# Patient Record
Sex: Female | Born: 1980 | Race: White | Hispanic: No | Marital: Married | Smoking: Never smoker
Health system: Southern US, Community
[De-identification: ages and names within clinical notes are randomized; demographics above are authoritative.]

## PROBLEM LIST (undated history)

## (undated) DIAGNOSIS — K5190 Ulcerative colitis, unspecified, without complications: Principal | ICD-10-CM

## (undated) DIAGNOSIS — Z8679 Personal history of other diseases of the circulatory system: Secondary | ICD-10-CM

## (undated) DIAGNOSIS — Z8619 Personal history of other infectious and parasitic diseases: Secondary | ICD-10-CM

## (undated) DIAGNOSIS — R7611 Nonspecific reaction to tuberculin skin test without active tuberculosis: Secondary | ICD-10-CM

## (undated) HISTORY — PX: LASIK: SHX215

## (undated) HISTORY — DX: Personal history of other infectious and parasitic diseases: Z86.19

## (undated) HISTORY — DX: Ulcerative colitis, unspecified, without complications: K51.90

## (undated) HISTORY — DX: Personal history of other diseases of the circulatory system: Z86.79

## (undated) HISTORY — DX: Nonspecific reaction to tuberculin skin test without active tuberculosis: R76.11

---

## 2001-07-02 DIAGNOSIS — K5190 Ulcerative colitis, unspecified, without complications: Secondary | ICD-10-CM

## 2001-07-02 HISTORY — DX: Ulcerative colitis, unspecified, without complications: K51.90

## 2003-07-03 HISTORY — PX: CHOLECYSTECTOMY: SHX55

## 2009-07-02 DIAGNOSIS — R7611 Nonspecific reaction to tuberculin skin test without active tuberculosis: Secondary | ICD-10-CM

## 2009-07-02 HISTORY — DX: Nonspecific reaction to tuberculin skin test without active tuberculosis: R76.11

## 2010-04-14 ENCOUNTER — Ambulatory Visit: Payer: Self-pay

## 2010-10-21 ENCOUNTER — Emergency Department: Payer: Self-pay | Admitting: Emergency Medicine

## 2010-12-01 ENCOUNTER — Other Ambulatory Visit: Payer: Self-pay | Admitting: Internal Medicine

## 2011-01-30 ENCOUNTER — Ambulatory Visit: Payer: Self-pay | Admitting: Obstetrics and Gynecology

## 2011-03-13 ENCOUNTER — Other Ambulatory Visit: Payer: Self-pay | Admitting: Obstetrics and Gynecology

## 2011-04-03 ENCOUNTER — Ambulatory Visit: Payer: Self-pay | Admitting: Obstetrics and Gynecology

## 2011-06-07 ENCOUNTER — Observation Stay: Payer: Self-pay | Admitting: Obstetrics and Gynecology

## 2011-06-14 ENCOUNTER — Encounter: Payer: Self-pay | Admitting: Obstetrics and Gynecology

## 2011-07-05 ENCOUNTER — Encounter: Payer: Self-pay | Admitting: Maternal & Fetal Medicine

## 2011-08-16 ENCOUNTER — Inpatient Hospital Stay: Payer: Self-pay | Admitting: Obstetrics and Gynecology

## 2011-08-16 LAB — CBC WITH DIFFERENTIAL/PLATELET
Basophil #: 0.1 10*3/uL (ref 0.0–0.1)
Basophil %: 0.5 %
Eosinophil #: 0.2 10*3/uL (ref 0.0–0.7)
Eosinophil %: 1.7 %
HCT: 36 % (ref 35.0–47.0)
Lymphocyte #: 2.3 10*3/uL (ref 1.0–3.6)
MCH: 32.2 pg (ref 26.0–34.0)
MCV: 94 fL (ref 80–100)
Monocyte #: 1 10*3/uL — ABNORMAL HIGH (ref 0.0–0.7)
Monocyte %: 7.4 %
Neutrophil #: 9.4 10*3/uL — ABNORMAL HIGH (ref 1.4–6.5)
Neutrophil %: 72.6 %
Platelet: 218 10*3/uL (ref 150–440)
RDW: 13.2 % (ref 11.5–14.5)
WBC: 13 10*3/uL — ABNORMAL HIGH (ref 3.6–11.0)

## 2011-10-22 DIAGNOSIS — J029 Acute pharyngitis, unspecified: Secondary | ICD-10-CM | POA: Insufficient documentation

## 2011-12-06 DIAGNOSIS — R5383 Other fatigue: Secondary | ICD-10-CM | POA: Insufficient documentation

## 2012-07-02 HISTORY — PX: COLONOSCOPY: SHX174

## 2013-01-19 ENCOUNTER — Ambulatory Visit: Payer: Self-pay | Admitting: Urgent Care

## 2013-01-19 LAB — HCG, QUANTITATIVE, PREGNANCY: Beta Hcg, Quant.: <1 m[IU]/mL — ABNORMAL LOW

## 2013-01-22 ENCOUNTER — Ambulatory Visit: Payer: Self-pay | Admitting: Gastroenterology

## 2013-01-26 LAB — PATHOLOGY REPORT

## 2013-02-03 ENCOUNTER — Other Ambulatory Visit: Payer: Self-pay | Admitting: Urgent Care

## 2013-02-03 LAB — COMPREHENSIVE METABOLIC PANEL
Albumin: 3.8 g/dL (ref 3.4–5.0)
Alkaline Phosphatase: 61 U/L (ref 50–136)
Bilirubin,Total: 0.4 mg/dL (ref 0.2–1.0)
Co2: 30 mmol/L (ref 21–32)
Creatinine: 0.71 mg/dL (ref 0.60–1.30)
EGFR (African American): >60
EGFR (Non-African Amer.): >60
Potassium: 3.9 mmol/L (ref 3.5–5.1)
SGOT(AST): 24 U/L (ref 15–37)
Total Protein: 7.6 g/dL (ref 6.4–8.2)

## 2013-02-03 LAB — CBC WITH DIFFERENTIAL/PLATELET
Basophil #: 0.1 10*3/uL (ref 0.0–0.1)
Basophil %: 1 %
Eosinophil #: 0.4 10*3/uL (ref 0.0–0.7)
Eosinophil %: 4 %
HCT: 36.5 % (ref 35.0–47.0)
HGB: 12.9 g/dL (ref 12.0–16.0)
MCH: 31.7 pg (ref 26.0–34.0)
MCV: 90 fL (ref 80–100)
Neutrophil #: 6.8 10*3/uL — ABNORMAL HIGH (ref 1.4–6.5)
Neutrophil %: 72.2 %
Platelet: 360 10*3/uL (ref 150–440)
RBC: 4.08 10*6/uL (ref 3.80–5.20)
WBC: 9.4 10*3/uL (ref 3.6–11.0)

## 2013-02-10 ENCOUNTER — Ambulatory Visit: Payer: Self-pay | Admitting: Urgent Care

## 2013-03-17 ENCOUNTER — Encounter: Payer: Self-pay | Admitting: Family Medicine

## 2013-03-17 ENCOUNTER — Ambulatory Visit (INDEPENDENT_AMBULATORY_CARE_PROVIDER_SITE_OTHER): Admitting: Family Medicine

## 2013-03-17 VITALS — BP 118/76 | HR 79 | Temp 98.4°F | Ht 66.0 in | Wt 123.0 lb

## 2013-03-17 DIAGNOSIS — K5190 Ulcerative colitis, unspecified, without complications: Secondary | ICD-10-CM

## 2013-03-17 DIAGNOSIS — R7611 Nonspecific reaction to tuberculin skin test without active tuberculosis: Secondary | ICD-10-CM

## 2013-03-17 NOTE — Assessment & Plan Note (Signed)
Stable. Followed by GI.  Has f/u appt next week.  Recent dx but likely present since 2003

## 2013-03-17 NOTE — Assessment & Plan Note (Signed)
Sounds like she had true seroconversion, normal CxR. I recommended she check with GI re INH therapy in setting of UC, and then f/u with health department for treatment. Pt agrees with plan.

## 2013-03-17 NOTE — Progress Notes (Signed)
Subjective:    Patient ID: Mary Stephenson, female    DOB: 1981-03-25, 32 y.o.   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  Prior saw Dr. Veneda Melter but she recently moved.  UC - dx 11/2012.  Present since 2003 - but recently dx with Ulcerative Colitis.  On mesalamine bid for this.  Just finished steroid course for this.  Colonoscopy done this year.  EGD done as well - normal.  Certain foods trigger (such as high fiber foods).  GI - Dr. Servando Snare and Lorenza Burton [facility] surgical @ [facility]).  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  Recommended treatment, however nursing and then became pregnant.  Never f/u with health dept.  Interested in treatment.  Preventative: Well woman (12/2012) with GYN - Dr. 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  Past histories reviewed and updated if relevant as below. There are no active problems to display for this patient.  Past Medical History  Diagnosis Date  . Ulcerative colitis 2003  . History of chicken pox   . 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 Rheumatic fever     treated, s/p preventative abx for years   Past Surgical History  Procedure Laterality Date  . Cholecystectomy  2005   History  Substance Use Topics  . Smoking status: Never Smoker   . Smokeless tobacco: Never Used  . Alcohol Use: Yes     Comment: occassionally   Family History  Problem Relation Age of Onset  . Diabetes type I Brother 13  . Cancer Paternal Grandfather     esophageal (smoker)  . Cancer Other      breast (great grandmother)  . Cancer Other     colon (maternal great uncles)  . CAD Neg Hx   . Stroke Neg Hx   . Hypertension Neg Hx    Allergies  Allergen Reactions  . Penicillins Hives  . Sulfa Antibiotics Hives   No current outpatient prescriptions on file prior to visit.   No current facility-administered medications on file prior to visit.     Review of Systems  Constitutional: Negative for fever, chills, activity change, appetite change, fatigue and unexpected weight change.  HENT: Negative for hearing loss and neck pain.   Eyes: Negative for visual disturbance.  Respiratory: Negative for cough, chest tightness, shortness of breath and wheezing.   Cardiovascular: Negative for chest pain, palpitations and leg swelling.  Gastrointestinal: Negative for nausea, vomiting, abdominal pain, diarrhea, constipation, blood in stool and abdominal distention.  Genitourinary: Negative for hematuria and difficulty urinating.  Musculoskeletal: Negative for myalgias and arthralgias.  Skin: Negative for rash.  Neurological: Negative for dizziness, seizures, syncope and headaches.  Hematological: Negative for adenopathy. Does not bruise/bleed easily.  Psychiatric/Behavioral: Negative for dysphoric mood. The patient is not nervous/anxious.        Objective:   Physical Exam  Nursing note and vitals reviewed. Constitutional: She is oriented to person, place, and time. She appears well-developed and well-nourished. No distress.  HENT:  Head:  Normocephalic and atraumatic.  Right Ear: External ear normal.  Left Ear: External ear normal.  Nose: Nose normal.  Mouth/Throat: Oropharynx is clear and moist. No oropharyngeal exudate.  Eyes: Conjunctivae and EOM are normal. Pupils are equal, round, and reactive to light. No scleral icterus.  Neck: Normal range of motion. Neck supple. No thyromegaly present.  Cardiovascular: Normal rate, regular rhythm, normal heart sounds and intact distal  pulses.   No murmur heard. Pulses:      Radial pulses are 2+ on the right side, and 2+ on the left side.  No M appreciated  Pulmonary/Chest: Effort normal and breath sounds normal. No respiratory distress. She has no wheezes. She has no rales.  Abdominal: Soft. Bowel sounds are normal. She exhibits no distension and no mass. There is no tenderness. There is no rebound and no guarding.  Musculoskeletal: Normal range of motion. She exhibits no edema.  Lymphadenopathy:    She has no cervical adenopathy.  Neurological: She is alert and oriented to person, place, and time.  CN grossly intact, station and gait intact  Skin: Skin is warm and dry. No rash noted.  Psychiatric: She has a normal mood and affect. Her behavior is normal. Judgment and thought content normal.       Assessment & Plan:

## 2013-03-17 NOTE — Patient Instructions (Signed)
Nice to meet you today, call us with questions. Return in 1 year for physical, prior fasting for blood work

## 2013-07-13 ENCOUNTER — Ambulatory Visit: Payer: Self-pay | Admitting: Family Medicine

## 2013-12-07 ENCOUNTER — Encounter: Payer: Self-pay | Admitting: Family Medicine

## 2013-12-16 ENCOUNTER — Other Ambulatory Visit: Payer: Self-pay | Admitting: Urgent Care

## 2013-12-16 LAB — CBC WITH DIFFERENTIAL/PLATELET
Basophil #: 0.1 10*3/uL (ref 0.0–0.1)
Basophil %: 1.3 %
EOS PCT: 8.4 %
Eosinophil #: 0.5 10*3/uL (ref 0.0–0.7)
HCT: 40.1 % (ref 35.0–47.0)
HGB: 13.4 g/dL (ref 12.0–16.0)
LYMPHS ABS: 1.8 10*3/uL (ref 1.0–3.6)
Lymphocyte %: 28 %
MCH: 30.9 pg (ref 26.0–34.0)
MCHC: 33.4 g/dL (ref 32.0–36.0)
MCV: 93 fL (ref 80–100)
Monocyte #: 0.5 x10 3/mm (ref 0.2–0.9)
Monocyte %: 7.3 %
NEUTROS ABS: 3.5 10*3/uL (ref 1.4–6.5)
Neutrophil %: 55 %
PLATELETS: 307 10*3/uL (ref 150–440)
RBC: 4.34 10*6/uL (ref 3.80–5.20)
RDW: 12.2 % (ref 11.5–14.5)
WBC: 6.3 10*3/uL (ref 3.6–11.0)

## 2013-12-16 LAB — COMPREHENSIVE METABOLIC PANEL
Albumin: 4 g/dL (ref 3.4–5.0)
Alkaline Phosphatase: 50 U/L
Anion Gap: 4 — ABNORMAL LOW (ref 7–16)
BUN: 7 mg/dL (ref 7–18)
Bilirubin,Total: 0.5 mg/dL (ref 0.2–1.0)
Calcium, Total: 8.7 mg/dL (ref 8.5–10.1)
Chloride: 103 mmol/L (ref 98–107)
Co2: 31 mmol/L (ref 21–32)
Creatinine: 0.66 mg/dL (ref 0.60–1.30)
Glucose: 84 mg/dL (ref 65–99)
Osmolality: 273 (ref 275–301)
Potassium: 3.9 mmol/L (ref 3.5–5.1)
SGOT(AST): 31 U/L (ref 15–37)
SGPT (ALT): 24 U/L (ref 12–78)
Sodium: 138 mmol/L (ref 136–145)
Total Protein: 7.7 g/dL (ref 6.4–8.2)

## 2013-12-17 LAB — CLOSTRIDIUM DIFFICILE(ARMC)

## 2014-01-20 ENCOUNTER — Other Ambulatory Visit: Payer: Self-pay | Admitting: Urgent Care

## 2014-01-20 LAB — CBC WITH DIFFERENTIAL/PLATELET
Basophil #: 0.1 10*3/uL (ref 0.0–0.1)
Basophil %: 1.3 %
EOS ABS: 0.5 10*3/uL (ref 0.0–0.7)
Eosinophil %: 6 %
HCT: 41.2 % (ref 35.0–47.0)
HGB: 13.5 g/dL (ref 12.0–16.0)
LYMPHS PCT: 23.9 %
Lymphocyte #: 1.8 10*3/uL (ref 1.0–3.6)
MCH: 30.8 pg (ref 26.0–34.0)
MCHC: 32.8 g/dL (ref 32.0–36.0)
MCV: 94 fL (ref 80–100)
MONOS PCT: 6.9 %
Monocyte #: 0.5 x10 3/mm (ref 0.2–0.9)
Neutrophil #: 4.7 10*3/uL (ref 1.4–6.5)
Neutrophil %: 61.9 %
Platelet: 342 10*3/uL (ref 150–440)
RBC: 4.38 10*6/uL (ref 3.80–5.20)
RDW: 12.2 % (ref 11.5–14.5)
WBC: 7.6 10*3/uL (ref 3.6–11.0)

## 2014-01-20 LAB — COMPREHENSIVE METABOLIC PANEL
ALBUMIN: 4.3 g/dL (ref 3.4–5.0)
ALK PHOS: 52 U/L
ALT: 23 U/L
ANION GAP: 4 — AB (ref 7–16)
AST: 28 U/L (ref 15–37)
BUN: 8 mg/dL (ref 7–18)
Bilirubin,Total: 0.6 mg/dL (ref 0.2–1.0)
CREATININE: 0.71 mg/dL (ref 0.60–1.30)
Calcium, Total: 9 mg/dL (ref 8.5–10.1)
Chloride: 106 mmol/L (ref 98–107)
Co2: 29 mmol/L (ref 21–32)
EGFR (African American): >60
Glucose: 97 mg/dL (ref 65–99)
OSMOLALITY: 276 (ref 275–301)
Potassium: 3.8 mmol/L (ref 3.5–5.1)
SODIUM: 139 mmol/L (ref 136–145)
TOTAL PROTEIN: 8.5 g/dL — AB (ref 6.4–8.2)

## 2014-03-14 ENCOUNTER — Other Ambulatory Visit: Payer: Self-pay | Admitting: Family Medicine

## 2014-03-14 DIAGNOSIS — K5190 Ulcerative colitis, unspecified, without complications: Secondary | ICD-10-CM

## 2014-03-14 DIAGNOSIS — Z0000 Encounter for general adult medical examination without abnormal findings: Secondary | ICD-10-CM | POA: Insufficient documentation

## 2014-03-14 DIAGNOSIS — Z13220 Encounter for screening for lipoid disorders: Secondary | ICD-10-CM

## 2014-03-15 ENCOUNTER — Other Ambulatory Visit

## 2014-03-16 ENCOUNTER — Other Ambulatory Visit (INDEPENDENT_AMBULATORY_CARE_PROVIDER_SITE_OTHER): Payer: 59

## 2014-03-16 DIAGNOSIS — K5190 Ulcerative colitis, unspecified, without complications: Secondary | ICD-10-CM

## 2014-03-16 DIAGNOSIS — Z13220 Encounter for screening for lipoid disorders: Secondary | ICD-10-CM

## 2014-03-16 LAB — LIPID PANEL
CHOLESTEROL: 136 mg/dL (ref 0–200)
HDL: 48.7 mg/dL (ref >39.00)
LDL Cholesterol: 74 mg/dL (ref 0–99)
NonHDL: 87.3
Total CHOL/HDL Ratio: 3
Triglycerides: 66 mg/dL (ref 0.0–149.0)
VLDL: 13.2 mg/dL (ref 0.0–40.0)

## 2014-03-16 LAB — COMPREHENSIVE METABOLIC PANEL
ALT: 16 U/L (ref 0–35)
AST: 21 U/L (ref 0–37)
Albumin: 4.5 g/dL (ref 3.5–5.2)
Alkaline Phosphatase: 46 U/L (ref 39–117)
BILIRUBIN TOTAL: 0.9 mg/dL (ref 0.2–1.2)
BUN: 10 mg/dL (ref 6–23)
CO2: 29 meq/L (ref 19–32)
CREATININE: 0.7 mg/dL (ref 0.4–1.2)
Calcium: 9.5 mg/dL (ref 8.4–10.5)
Chloride: 103 mEq/L (ref 96–112)
GFR: 97.95 mL/min (ref >60.00)
GLUCOSE: 91 mg/dL (ref 70–99)
Potassium: 3.8 mEq/L (ref 3.5–5.1)
Sodium: 139 mEq/L (ref 135–145)
Total Protein: 7.7 g/dL (ref 6.0–8.3)

## 2014-03-17 LAB — CBC WITH DIFFERENTIAL/PLATELET
BASOS PCT: 0.5 % (ref 0.0–3.0)
Basophils Absolute: 0 10*3/uL (ref 0.0–0.1)
Eosinophils Absolute: 0.4 10*3/uL (ref 0.0–0.7)
Eosinophils Relative: 6.5 % — ABNORMAL HIGH (ref 0.0–5.0)
HCT: 39.8 % (ref 36.0–46.0)
Hemoglobin: 13.4 g/dL (ref 12.0–15.0)
LYMPHS PCT: 27.7 % (ref 12.0–46.0)
Lymphs Abs: 1.9 10*3/uL (ref 0.7–4.0)
MCHC: 33.6 g/dL (ref 30.0–36.0)
MCV: 93.1 fl (ref 78.0–100.0)
MONOS PCT: 7.1 % (ref 3.0–12.0)
Monocytes Absolute: 0.5 10*3/uL (ref 0.1–1.0)
Neutro Abs: 3.9 10*3/uL (ref 1.4–7.7)
Neutrophils Relative %: 58.2 % (ref 43.0–77.0)
Platelets: 349 10*3/uL (ref 150.0–400.0)
RBC: 4.27 Mil/uL (ref 3.87–5.11)
RDW: 13.3 % (ref 11.5–15.5)
WBC: 6.7 10*3/uL (ref 4.0–10.5)

## 2014-03-22 ENCOUNTER — Encounter: Admitting: Family Medicine

## 2014-03-29 ENCOUNTER — Encounter: Admitting: Family Medicine

## 2014-04-01 ENCOUNTER — Encounter: Payer: Self-pay | Admitting: Family Medicine

## 2014-04-01 ENCOUNTER — Ambulatory Visit (INDEPENDENT_AMBULATORY_CARE_PROVIDER_SITE_OTHER): Payer: 59 | Admitting: Family Medicine

## 2014-04-01 VITALS — BP 104/60 | HR 72 | Temp 98.3°F | Ht 66.0 in | Wt 122.2 lb

## 2014-04-01 DIAGNOSIS — R7611 Nonspecific reaction to tuberculin skin test without active tuberculosis: Secondary | ICD-10-CM

## 2014-04-01 DIAGNOSIS — M533 Sacrococcygeal disorders, not elsewhere classified: Secondary | ICD-10-CM

## 2014-04-01 DIAGNOSIS — Z7952 Long term (current) use of systemic steroids: Secondary | ICD-10-CM

## 2014-04-01 DIAGNOSIS — Z0000 Encounter for general adult medical examination without abnormal findings: Secondary | ICD-10-CM

## 2014-04-01 DIAGNOSIS — K5190 Ulcerative colitis, unspecified, without complications: Secondary | ICD-10-CM

## 2014-04-01 NOTE — Progress Notes (Signed)
BP 104/60  Pulse 72  Temp(Src) 98.3 F (36.8 C) (Oral)  Ht 5\' 6"  (1.676 m)  Wt 122 lb 4 oz (55.452 kg)  BMI 19.74 kg/m2  SpO2 98%  LMP 03/09/2014   CC: CPE  Subjective:    Patient ID: Mary Stephenson, female    DOB: May 18, 1981, 33 y.o.   MRN: [id number]  HPI: Mary Stephenson is a 33 y.o. female presenting on 04/01/2014 for Annual Exam, Check Knot on Bottom and Discuss getting a Bone density test    Rec DEXA by GI given h/o UC and prednisone.   She did complete 4 mo course of rifampin for recent TB serocoversion with pos quantiferon test. This flared UC - treated with prolonged prednisone taper over 4-6 weeks. Off prednisone for last 1+ month.  Pain in tailbone area - stays active with 2 small children. [place]. Small lump noted   Preventative: Well woman (12/2012) with GYN - Dr. Sharyl Nimrodifranchesco [facility]  Colonoscopy 2014  Flu shot - to get at work  Tdap - 2010  LMP - 03/09/2014, husband with vasectomy  Lives with husband and 2 children (2010, 2013), 2 dogs  Occupation: Education officer, environmentalsonographer at [employer]  Edu: College  Activity: no regular exercise  Diet: good water, avoids high fiber 2/2 UC   Relevant past medical, surgical, family and social history reviewed and updated as indicated.  Allergies and medications reviewed and updated. Current Outpatient Prescriptions on File Prior to Visit  Medication Sig  . Ascorbic Acid (VITAMIN C) 1000 MG tablet Take 2,000 mg by mouth daily.  . B Complex-C (SUPER B COMPLEX PO) Take by mouth daily.  . cholecalciferol (VITAMIN D) 1000 UNITS tablet Take 1,000 Units by mouth daily.  . mesalamine (LIALDA) 1.2 G EC tablet Take 1 tabs by mouth two times daily  . Multiple Vitamin (MULTIVITAMIN) tablet Take 1 tablet by mouth daily.  Marland Kitchen. zinc gluconate 50 MG tablet Take 50 mg by mouth daily.   No current facility-administered medications on file prior to visit.    Review of Systems  Constitutional:  Negative for fever, chills, activity change, appetite change, fatigue and unexpected weight change.  HENT: Negative for hearing loss.   Eyes: Negative for visual disturbance.  Respiratory: Negative for cough, chest tightness, shortness of breath and wheezing.   Cardiovascular: Negative for chest pain, palpitations and leg swelling.  Gastrointestinal: Positive for abdominal pain (stable) and diarrhea (UC related). Negative for nausea, vomiting, constipation, blood in stool and abdominal distention.  Genitourinary: Negative for hematuria and difficulty urinating.  Musculoskeletal: Negative for arthralgias, myalgias and neck pain.  Skin: Negative for rash.  Neurological: Negative for dizziness, seizures, syncope and headaches.  Hematological: Negative for adenopathy. Does not bruise/bleed easily.  Psychiatric/Behavioral: Negative for dysphoric mood. The patient is not nervous/anxious.    Per HPI unless specifically indicated above    Objective:    BP 104/60  Pulse 72  Temp(Src) 98.3 F (36.8 C) (Oral)  Ht 5\' 6"  (1.676 m)  Wt 122 lb 4 oz (55.452 kg)  BMI 19.74 kg/m2  SpO2 98%  LMP 03/09/2014  Physical Exam  Nursing note and vitals reviewed. Constitutional: She is oriented to person, place, and time. She appears well-developed and well-nourished. No distress.  HENT:  Head: Normocephalic and atraumatic.  Right Ear: Hearing, tympanic membrane, external ear and ear canal normal.  Left Ear: Hearing, tympanic membrane, external ear and ear canal normal.  Nose: Nose normal.  Mouth/Throat: Uvula is midline, oropharynx is clear and moist and mucous membranes are normal. No oropharyngeal exudate, posterior oropharyngeal edema or posterior oropharyngeal erythema.  Eyes: Conjunctivae and EOM are normal. Pupils are equal, round, and reactive to light. No scleral icterus.  Neck: Normal range of motion. Neck supple. No thyromegaly present.  Cardiovascular: Normal rate, regular rhythm, normal heart  sounds and intact distal pulses.   No murmur heard. Pulses:      Radial pulses are 2+ on the right side, and 2+ on the left side.  Pulmonary/Chest: Effort normal and breath sounds normal. No respiratory distress. She has no wheezes. She has no rales.  Abdominal: Soft. Bowel sounds are normal. She exhibits no distension and no mass. There is no tenderness. There is no rebound and no guarding.  Musculoskeletal: Normal range of motion. She exhibits no edema.  No midline spine tenderness Tender to palpation at tip of coccyx with firm swelling evident of left coccyx without erythema or induration.  Lymphadenopathy:    She has no cervical adenopathy.  Neurological: She is alert and oriented to person, place, and time.  CN grossly intact, station and gait intact  Skin: Skin is warm and dry. No rash noted.  Psychiatric: She has a normal mood and affect. Her behavior is normal. Judgment and thought content normal.   Results for orders placed in visit on 03/16/14  LIPID PANEL      Result Value Ref Range   Cholesterol 136  0 - 200 mg/dL   Triglycerides 16.1  0.0 - 149.0 mg/dL   HDL 09.60  >45.40 mg/dL   VLDL 98.1  0.0 - 19.1 mg/dL   LDL Cholesterol 74  0 - 99 mg/dL   Total CHOL/HDL Ratio 3     NonHDL 87.30    COMPREHENSIVE METABOLIC PANEL      Result Value Ref Range   Sodium 139  135 - 145 mEq/L   Potassium 3.8  3.5 - 5.1 mEq/L   Chloride 103  96 - 112 mEq/L   CO2 29  19 - 32 mEq/L   Glucose, Bld 91  70 - 99 mg/dL   BUN 10  6 - 23 mg/dL   Creatinine, Ser 0.7  0.4 - 1.2 mg/dL   Total Bilirubin 0.9  0.2 - 1.2 mg/dL   Alkaline Phosphatase 46  39 - 117 U/L   AST 21  0 - 37 U/L   ALT 16  0 - 35 U/L   Total Protein 7.7  6.0 - 8.3 g/dL   Albumin 4.5  3.5 - 5.2 g/dL   Calcium 9.5  8.4 - 47.8 mg/dL   GFR 29.56  >21.30 mL/min  CBC WITH DIFFERENTIAL      Result Value Ref Range   WBC 6.7  4.0 - 10.5 K/uL   RBC 4.27  3.87 - 5.11 Mil/uL   Hemoglobin 13.4  12.0 - 15.0 g/dL   HCT 86.5  78.4 -  69.6 %   MCV 93.1  78.0 - 100.0 fl   MCHC 33.6  30.0 - 36.0 g/dL   RDW 29.5  28.4 - 13.2 %   Platelets 349.0  150.0 - 400.0 K/uL   Neutrophils Relative % 58.2  43.0 - 77.0 %   Lymphocytes Relative 27.7  12.0 - 46.0 %   Monocytes Relative 7.1  3.0 - 12.0 %   Eosinophils Relative 6.5 (*) 0.0 - 5.0 %   Basophils Relative 0.5  0.0 - 3.0 %   Neutro Abs 3.9  1.4 - 7.7 K/uL   Lymphs Abs 1.9  0.7 - 4.0 K/uL   Monocytes Absolute 0.5  0.1 - 1.0 K/uL   Eosinophils Absolute 0.4  0.0 - 0.7 K/uL   Basophils Absolute 0.0  0.0 - 0.1 K/uL      Assessment & Plan:   Problem List Items Addressed This Visit   Ulcerative colitis     Appreciate GI care. Recent flare completed prednisone use. Will check baseline DEXA in setting of UC and prednisone use    Relevant Orders      DG Bone Density   Positive TB test     Completed treatment with rifampin x 4 months. rec no further testing.    Health maintenance examination - Primary     Preventative protocols reviewed and updated unless pt declined. Discussed healthy diet and lifestyle.     Coccyx pain     ?bony contusion of coccyx vs coccydynia. Not consistent with cyst or fistula. Pt will start using cushions when seated on firm surfaces and update me with effect in 1-2 weeks.  Consider xray if not improving.     Other Visit Diagnoses   Current use of steroid medication        Relevant Orders       DG Bone Density        Follow up plan: Return in about 1 year (around 04/02/2015), or as needed, for physical.

## 2014-04-01 NOTE — Assessment & Plan Note (Signed)
Completed treatment with rifampin x 4 months. rec no further testing.

## 2014-04-01 NOTE — Progress Notes (Signed)
Pre visit review using our clinic review tool, if applicable. No additional management support is needed unless otherwise documented below in the visit note. 

## 2014-04-01 NOTE — Assessment & Plan Note (Signed)
?  bony contusion of coccyx vs coccydynia. Not consistent with cyst or fistula. Pt will start using cushions when seated on firm surfaces and update me with effect in 1-2 weeks.  Consider xray if not improving.

## 2014-04-01 NOTE — Patient Instructions (Addendum)
Good to see you today! Call us with questions. Pass by [name]'s office to schedule bone density scan. Use cushion when sitting on firm surface - and update us if not improving as expected in 1-2 weeks.

## 2014-04-01 NOTE — Assessment & Plan Note (Signed)
Appreciate GI care. Recent flare completed prednisone use. Will check baseline DEXA in setting of UC and prednisone use

## 2014-04-01 NOTE — Assessment & Plan Note (Signed)
Preventative protocols reviewed and updated unless pt declined. Discussed healthy diet and lifestyle.  

## 2014-05-01 ENCOUNTER — Encounter: Payer: Self-pay | Admitting: Family Medicine

## 2014-05-24 DIAGNOSIS — K5150 Left sided colitis without complications: Secondary | ICD-10-CM | POA: Insufficient documentation

## 2014-05-24 DIAGNOSIS — Z8611 Personal history of tuberculosis: Secondary | ICD-10-CM | POA: Insufficient documentation

## 2014-06-01 HISTORY — PX: OTHER SURGICAL HISTORY: SHX169

## 2014-06-01 HISTORY — PX: FLEXIBLE SIGMOIDOSCOPY: SHX1649

## 2014-06-11 ENCOUNTER — Encounter: Payer: Self-pay | Admitting: Family Medicine

## 2014-06-11 ENCOUNTER — Ambulatory Visit: Payer: Self-pay | Admitting: Family Medicine

## 2014-06-12 ENCOUNTER — Encounter: Payer: Self-pay | Admitting: Family Medicine

## 2014-06-16 ENCOUNTER — Encounter: Payer: Self-pay | Admitting: *Deleted

## 2014-06-28 ENCOUNTER — Ambulatory Visit: Payer: Self-pay | Admitting: Gastroenterology

## 2014-07-10 ENCOUNTER — Encounter: Payer: Self-pay | Admitting: Family Medicine

## 2014-07-14 ENCOUNTER — Encounter: Payer: Self-pay | Admitting: Family Medicine

## 2014-09-09 IMAGING — CT CT ABD-PELV W/ CM
1 of 2 series · 15 of 32 positions shown, 19 images · IV contrast (isovue)
Comparison: none

REASON FOR EXAM: lower abd pain
COMMENTS:

PROCEDURE:     CT  - CT ABDOMEN / PELVIS  W  - January 19, 2013  [DATE]
RESULT:     Comparison:  None
TECHNIQUE: Multiple axial images of the abdomen and pelvis were performed
from the lung bases to the pubic symphysis, with p.o. contrast and with 100
mL of Isovue 300 intravenous contrast.

[Series 2: 3mm soft tissue · axial · 0.68mm/px · z∈[-930,-504]mm · 15 of 156 slices shown, 19 images]
[im 7/156  soft-tissue]
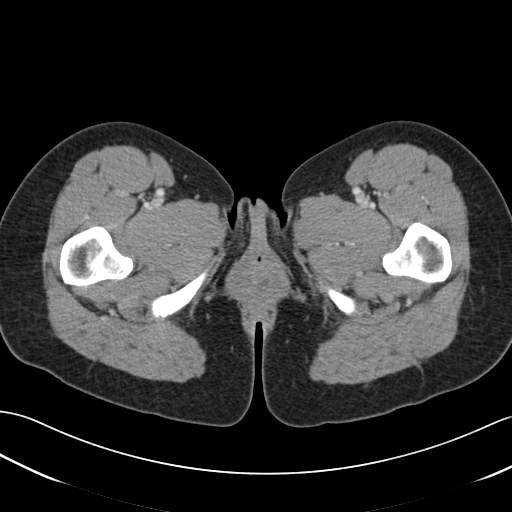
[im 7/156  bone]
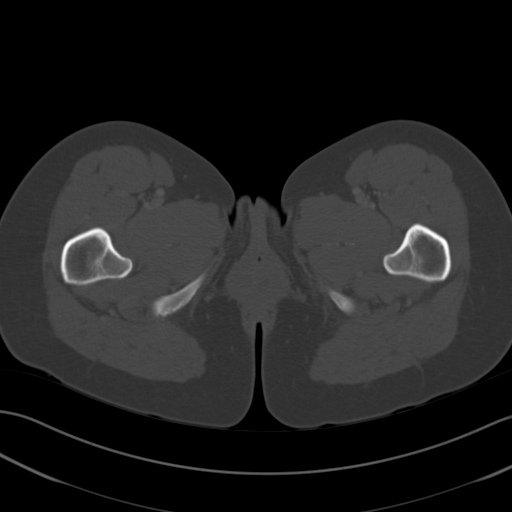
[im 21/156  soft-tissue]
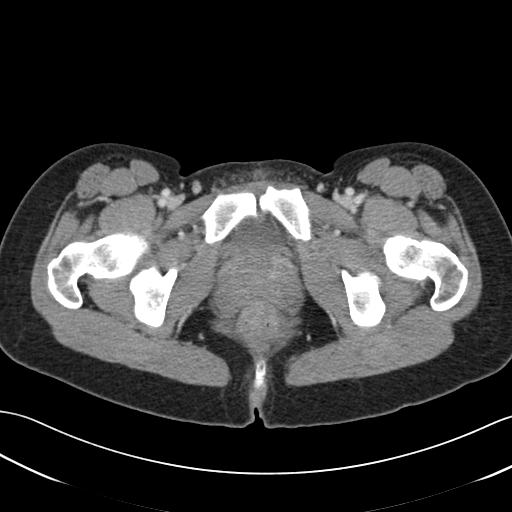
[im 34/156  soft-tissue]
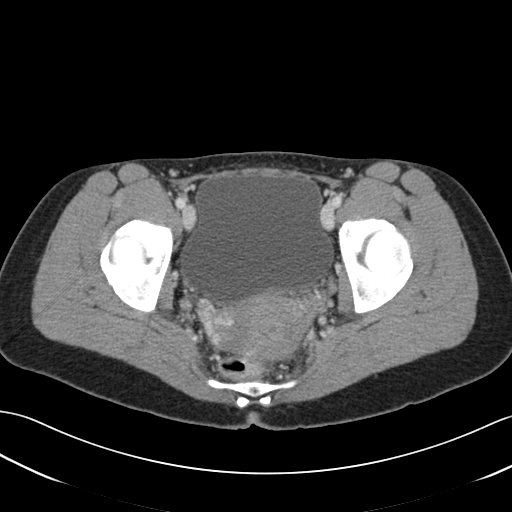
[im 41/156  soft-tissue]
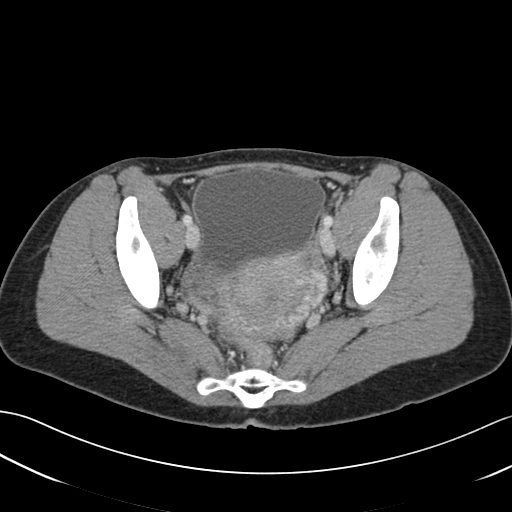
[im 54/156  soft-tissue]
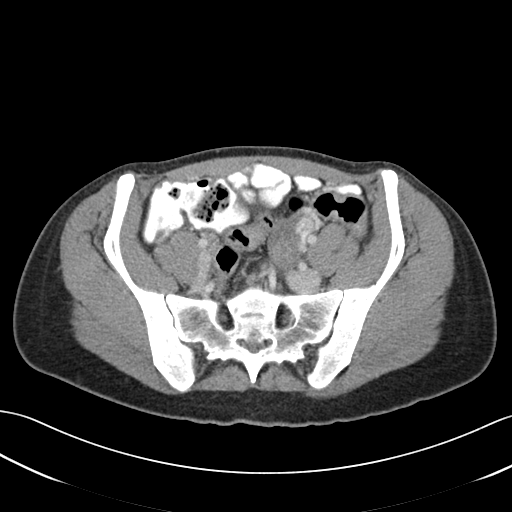
[im 68/156  soft-tissue]
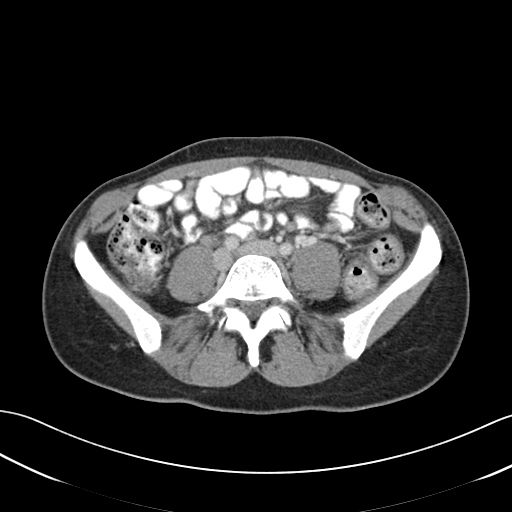
[im 81/156  soft-tissue]
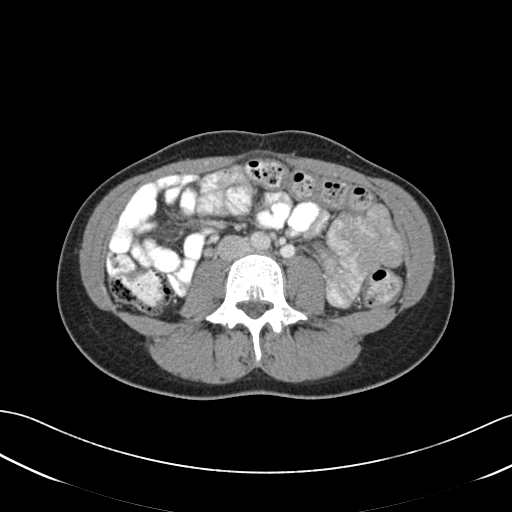
[im 88/156  soft-tissue]
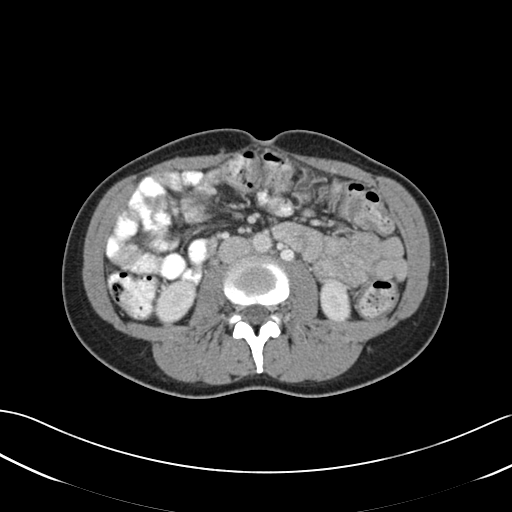
[im 102/156  soft-tissue]
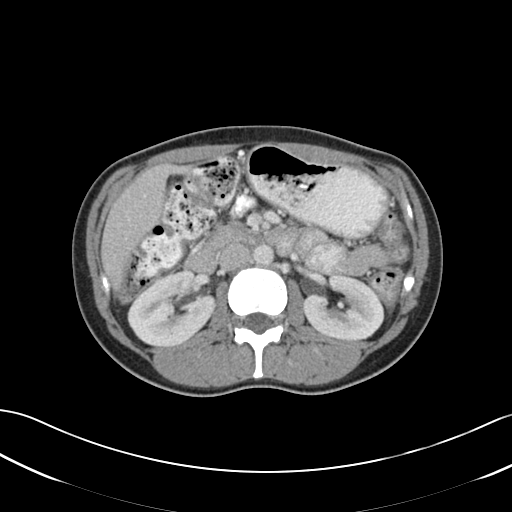
[im 102/156  bone]
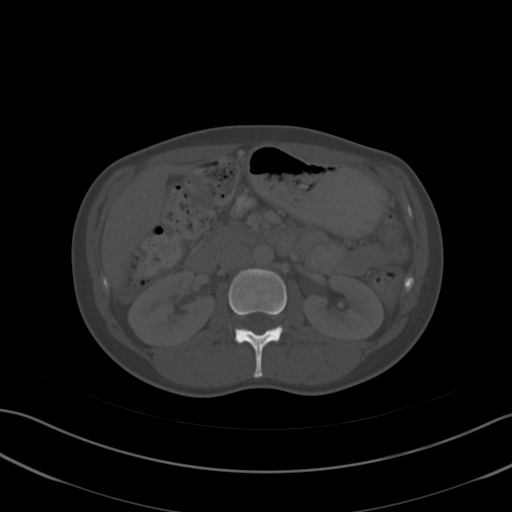
[im 115/156  soft-tissue]
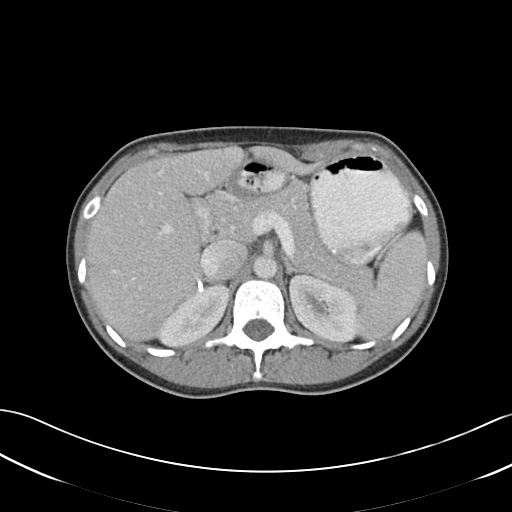
[im 122/156  soft-tissue]
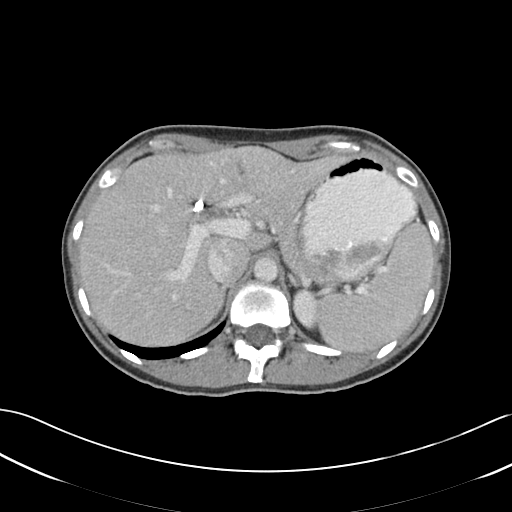
[im 129/156  lung]
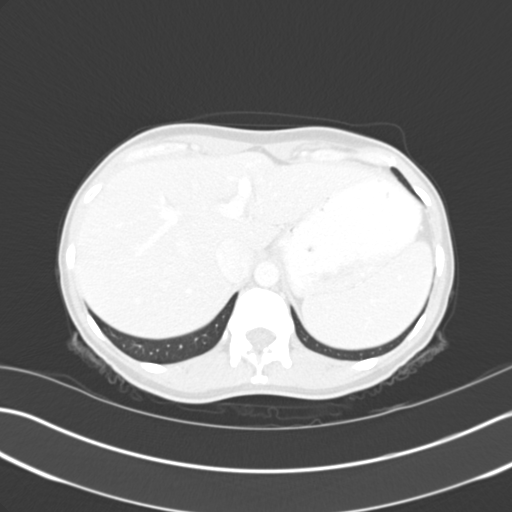
[im 135/156  soft-tissue]
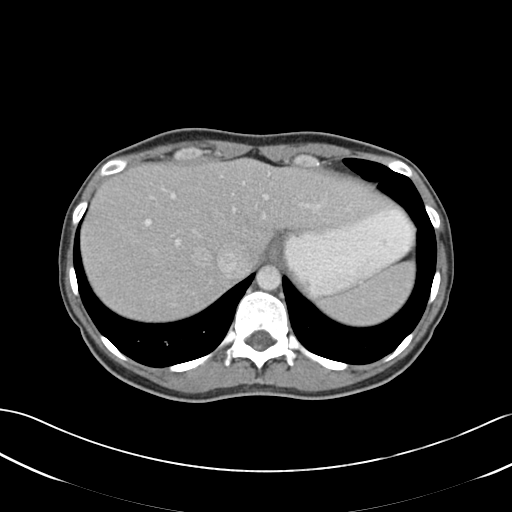
[im 135/156  lung]
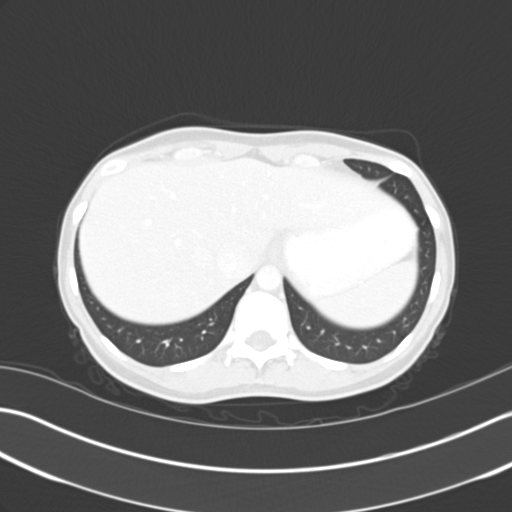
[im 142/156  lung]
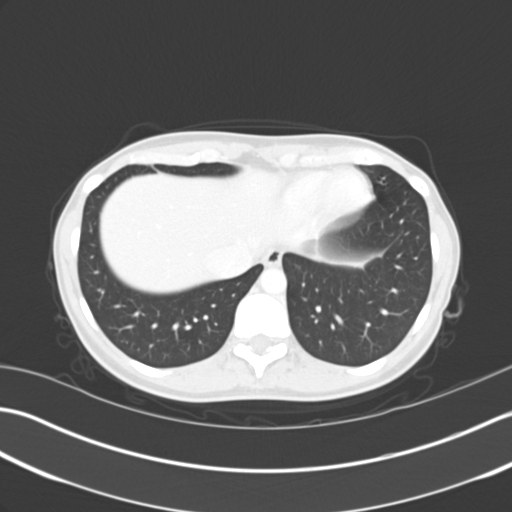
[im 149/156  soft-tissue]
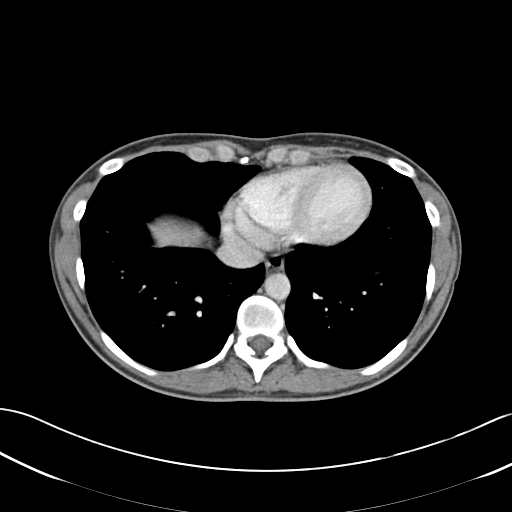
[im 149/156  lung]
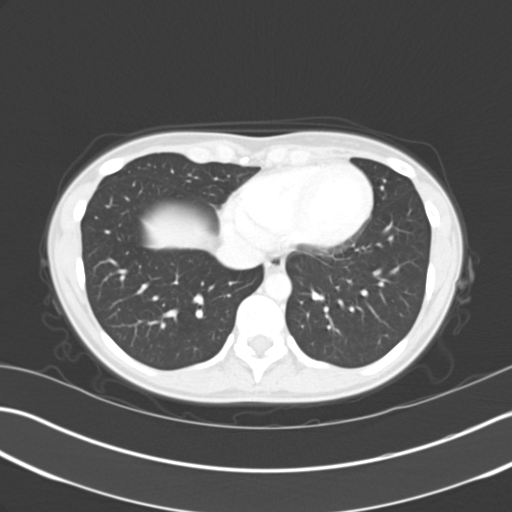

[15 of 32 positions shown; findings below may reference images not displayed]

FINDINGS: The liver, spleen, adrenals, and pancreas are unremarkable. Surgical clips
are seen from prior cholecystectomy. Multiple surgical clips are seen
posterior to the right hepatic lobe. The kidneys enhance normally.

The appendix is at the upper limits of normal in diameter. There are no
adjacent inflammatory changes. There is mild prominence of the wall of the
descending and sigmoid colon, which may in part be related to
underdistention. There is a trace amount of free fluid in pelvis, which is
likely physiologic.

No aggressive lytic or sclerotic osseous lesions are identified.
IMPRESSION: Mild prominence of the wall of the descending and sigmoid colon may in part
be related to underdistention. However, the possibility of colitis is not
excluded. Differential would include infectious and inflammatory etiologies.

[REDACTED]

The report was called to the ordering clinician immediately after the
dictation.

## 2014-11-09 NOTE — H&P (Signed)
L&D Evaluation:  History:   HPI 30 yowmf G2P1001, 39.[redacted] weeks gestation    Presents with IOL    Patient's Medical History No Chronic Illness    Patient's Surgical History none    Medications Pre Natal Vitamins    Allergies PCN, Sulfa    Social History none    Family History Non-Contributory   ROS:   ROS All systems were reviewed.  HEENT, CNS, GI, GU, Respiratory, CV, Renal and Musculoskeletal systems were found to be normal.   Exam:   Vital Signs stable    Urine Protein negative dipstick    General no apparent distress    Heart normal sinus rhythm    Abdomen gravid, non-tender    Estimated Fetal Weight Average for gestational age    Back no CVAT    Edema no edema    Pelvic no external lesions, 3/80/-2/vtx/BOWI, AROM clear    FHT normal rate with no decels    Skin dry    Lymph no lymphadenopathy   Impression:   Impression TIUP   Plan:   Plan Pitocin IOL    Comments O+/GBS-/HIV-/HB-/RI/VI   Electronic Signatures: Leandro Berkowitz, Prentice DockerMartin A (MD)  (Signed 14-Feb-13 12:20)  Authored: L&D Evaluation   Last Updated: 14-Feb-13 12:20 by Kandace Elrod, Prentice DockerMartin A (MD)

## 2014-12-20 ENCOUNTER — Other Ambulatory Visit: Payer: Self-pay | Admitting: Urgent Care

## 2014-12-20 NOTE — Telephone Encounter (Signed)
Pt has called to see if she needs to F/U with Lorenza Burton for medication refill. Pt is on Lialda and Canasa. Pt has 4 refills. Doing well. Pt was just concerned if she need a f/u with Lorenza Burton, last seen in July of 2015.

## 2014-12-20 NOTE — Telephone Encounter (Signed)
Spoke with patient at this time. States that she has not had any further care at [facility]. She just went one time for initial consultation. Pt placed on schedule to be seen 12/28/14 at 0930am.

## 2014-12-28 ENCOUNTER — Ambulatory Visit (INDEPENDENT_AMBULATORY_CARE_PROVIDER_SITE_OTHER): Payer: Federal, State, Local not specified - PPO | Admitting: Urgent Care

## 2014-12-28 ENCOUNTER — Encounter: Payer: Self-pay | Admitting: Urgent Care

## 2014-12-28 VITALS — BP 122/82 | HR 85 | Temp 98.6°F | Ht 66.0 in | Wt 129.4 lb

## 2014-12-28 DIAGNOSIS — K5190 Ulcerative colitis, unspecified, without complications: Secondary | ICD-10-CM

## 2014-12-28 MED ORDER — MESALAMINE 1.2 G PO TBEC: 4.8000 g | DELAYED_RELEASE_TABLET | Freq: Every day | ORAL | Status: DC

## 2014-12-28 NOTE — Patient Instructions (Signed)
Continue lialda 4.8 grams daily Canasa 1gram weekly Follow up with Dr Servando SnareWohl in 6 months or sooner if needed  Ulcerative Colitis Ulcerative colitis is a long lasting swelling and soreness (inflammation) of the colon (large intestine). In patients with ulcerative colitis, sores (ulcers) and inflammation of the inner lining of the colon lead to illness. Ulcerative colitis can also cause problems outside the digestive tract.  Ulcerative colitis is closely related to another condition of inflammation of the intestines called Crohn's disease. Together, they are frequently referred to as inflammatory bowel disease (IBD). Ulcerative colitis and Crohn's diseases are conditions that can last years to decades. Men and women are affected equally. They most commonly begin during adolescence and early adulthood. SYMPTOMS  Common symptoms of ulcerative colitis include rectal bleeding and diarrhea. There is a wide range of symptoms among patients with this disease depending on how severe the disease is. Some of these symptoms are:  Abdominal pain or cramping.  Diarrhea.  Fever.  Tiredness (fatigue).  Weight loss.  Night sweats.  Rectal pain.  Feeling the immediate need to have a bowel movement (rectal urgency). CAUSES  Ulcerative colitis is caused by increased activity of the immune system in the intestines. The immune system is the system that protects the body against disease such as harmful bacteria, viruses, fungi, and other foreign invaders. When the immune system overacts, it causes inflammation. The cause of the increased immune system activity is not known. This over activity causes long-lasting inflammation and ulceration. This condition may be passed down from your parents (inherited). Brothers, sisters, children, and parents of patients with IBD are more likely to develop these diseases. It is not contagious. This means you cannot catch it from someone else. DIAGNOSIS  Your caregiver may  suspect ulcerative colitis based on your symptoms and exam. Blood tests may confirm that there is a problem. You may be asked to submit a stool specimen for examination. X-rays and CT scans may be necessary. Ultimately, the diagnosis is usually made after a flexible tube is inserted via your anus and your colon is examined under sedation (colonoscopy). With this test, the specialist can take a tiny tissue sample from inside the bowel (biopsy). Examination of this biopsy tissue under a microscopy can reveal ulcerative colitis as the cause of your symptoms. TREATMENT   There is no cure for ulcerative colitis.  Complications such as massive bleeding from the colon (hemorrhage), development of a hole in the colon (perforation), or the development of precancerous or cancerous changes of the colon may require surgery.  Medications are often used to decrease inflammation and control the immune system. These include medicines related to aspirin, steroid medications, and newer and stronger medications to slow down the immune system. Some medications may be used as suppositories or enemas. A number of other medications are used or have been studied. Your caregiver will make specific recommendations. HOME CARE INSTRUCTIONS   There is no cure for ulcerative colitis disease. The best treatment is frequent checkups with your caregiver. Periodic reevaluation is important.  Symptoms such as diarrhea can be controlled with medications. Avoid foods that have a laxative effect such fresh fruit and vegetables and dairy products. During flare ups, you can rest your bowel by staying away from solid foods. Drink clear liquids frequently during the day. Electrolyte or rehydrating fluids are best. Your caregiver can help you with suggestions. Drink often to prevent dehydration. When diarrhea has cleared, eat smaller meals and more often. Avoid food additives and stimulants  such as caffeine (coffee, tea, many sodas, or  chocolate). Avoid dairy products. Enzyme supplements may help if you develop intolerance to a sugar in dairy products (lactose). Ask your caregiver or dietitian about specific dietary instructions.  If you had surgery, be sure you understand your care instructions thoroughly, including proper care of any surgical wounds.  Take any medications exactly as prescribed.  Try to maintain a positive attitude. Learn relaxation techniques such as self hypnosis, mental imaging, and muscle relaxation. If possible, avoid stresses that aggravate your condition. Exercise regularly. Follow your diet. Always get plenty of rest. SEEK MEDICAL CARE IF:   Your symptoms fail to improve after a week or two of new treatment.  You experience continued weight loss.  You have ongoing crampy digestion or loose bowels.  You develop a new skin rash, skin sores, or eye problems. SEEK IMMEDIATE MEDICAL CARE IF:   You have worsening of your symptoms or develop new symptoms.  You have an oral temperature above 102 F (38.9 C), not controlled by medicine.  You develop bloody diarrhea.  You have severe abdominal pain. Document Released: 03/28/2005 Document Revised: 09/10/2011 Document Reviewed: 02/25/2007 [facility] Patient Information 2015 [place], [place]. This information is not intended to replace advice given to you by your health care provider. Make sure you discuss any questions you have with your health care provider.

## 2014-12-28 NOTE — Progress Notes (Signed)
   Primary Care Physician: Eustaquio BoydenJavier Gutierrez, MD Primary Gastroenterologist:  Dr Servando SnareWohl  Chief Complaint  Patient presents with  . Follow-up    Ulcerative Colitis    HPI: Mary Stephenson is a 34 y.o. female here for follow up of ulcerative colitis.   Dx 2003.  She is eating very well. She is taking Lialda 4.8 g daily at lunch. She also used Canasa suppositories once weekly on Fridays. She abdominal pain, fever, nausea or vomiting. Denies any rectal bleeding or melena. Denies any diarrhea.  Current Outpatient Prescriptions  Medication Sig Dispense Refill  . Ascorbic Acid (VITAMIN C) 1000 MG tablet Take 2,000 mg by mouth daily.    . B Complex-C (SUPER B COMPLEX PO) Take by mouth daily.    . cholecalciferol (VITAMIN D) 1000 UNITS tablet Take 1,000 Units by mouth daily.    . mesalamine (CANASA) 1000 MG suppository Place 1,000 mg rectally once a week. Fridays    . mesalamine (LIALDA) 1.2 G EC tablet Take 4 tablets (4.8 g total) by mouth daily. 4 tabs daily 120 tablet 5  . Multiple Vitamin (MULTIVITAMIN) tablet Take 1 tablet by mouth daily.    . Probiotic Product (ALIGN PO) Take 1 tablet by mouth daily.    Marland Kitchen. zinc gluconate 50 MG tablet Take 50 mg by mouth daily.     No current facility-administered medications for this visit.    Allergies as of 12/28/2014 - Review Complete 12/28/2014  Allergen Reaction Noted  . Penicillins Hives 03/17/2013  . Sulfa antibiotics Hives 03/17/2013    Review of Systems: Gen: Denies any fever, chills, fatigue, weakness, malaise ENT: Negative for hoarseness, difficulty swallowing , nasal congestion CV: Denies chest pain, angina, palpitations, syncope, orthopnea, PND, peripheral edema, and claudication. Resp: Denies dyspnea at rest, dyspnea with exercise, cough, sputum, wheezing, coughing up blood, and pleurisy. GI: See HPI GU:  Negative for dysuria, hematuria, urinary incontinence, urinary frequency, nocturnal urination.  Endo: Negative for unusual  weight change or sweats Derm: Denies jaundice, rash, itching, or unhealing ulcers.  Psych: Denies depression, anxiety, memory loss, suicidal ideation, hallucinations, paranoia, and confusion. Heme: Denies bruising, bleeding, and enlarged lymph nodes.   Physical Examination:  BP 122/82 mmHg  Pulse 85  Temp(Src) 98.6 F (37 C) (Oral)  Ht 5\' 6"  (1.676 m)  Wt 129 lb 6.4 oz (58.695 kg)  BMI 20.90 kg/m2  LMP 12/27/2014 Body mass index is 20.9 kg/(m^2). Patient's last menstrual period was 12/27/2014. General:   Alert,  Well-developed, well-nourished, pleasant and cooperative in NAD Head:  Normocephalic and atraumatic. Eyes:  Sclera clear, no icterus.   Conjunctiva pink. Mouth:  No deformity or lesions.  Oropharynx pink & moist. Neck:  Supple; no masses or thyromegaly. Heart:  Regular rate and rhythm; no murmurs, clicks, rubs,  or gallops. Abdomen:   Normal bowel sounds.  Soft, nontender and nondistended. No masses, hepatosplenomegaly or hernias noted. No guarding or rebound tenderness.   Msk:  Symmetrical without gross deformities. Normal posture. Pulses:  Normal pulses noted. Extremities:  Without clubbing or edema. Neurologic:  Alert and  oriented x3;  grossly normal neurologically. Skin:  Intact without significant lesions or rashes. Cervical Nodes:  No significant cervical adenopathy. Psych:  Alert and cooperative. Normal mood and affect.

## 2014-12-28 NOTE — Assessment & Plan Note (Signed)
Doing very well. Continue Lialda and Canasa as ordered Follow Up with Dr. Servando SnareWohl in 6 months

## 2015-01-11 ENCOUNTER — Encounter: Payer: Self-pay | Admitting: Obstetrics and Gynecology

## 2015-01-11 ENCOUNTER — Ambulatory Visit (INDEPENDENT_AMBULATORY_CARE_PROVIDER_SITE_OTHER): Payer: Federal, State, Local not specified - PPO | Admitting: Obstetrics and Gynecology

## 2015-01-11 VITALS — BP 115/69 | HR 81 | Ht 67.0 in | Wt 128.5 lb

## 2015-01-11 DIAGNOSIS — K5190 Ulcerative colitis, unspecified, without complications: Secondary | ICD-10-CM | POA: Diagnosis not present

## 2015-01-11 DIAGNOSIS — Z0000 Encounter for general adult medical examination without abnormal findings: Secondary | ICD-10-CM

## 2015-01-11 DIAGNOSIS — N907 Vulvar cyst: Secondary | ICD-10-CM | POA: Diagnosis not present

## 2015-01-11 DIAGNOSIS — Z01419 Encounter for gynecological examination (general) (routine) without abnormal findings: Secondary | ICD-10-CM

## 2015-01-11 NOTE — Progress Notes (Signed)
Patient ID: Mary Stephenson, female   DOB: 1981/04/21, 34 y.o.   MRN: [id number] ANNUAL PREVENTATIVE CARE GYN  ENCOUNTER NOTE  Subjective:       Mary Stephenson is a 34 y.o. No obstetric history on file. female here for a routine annual gynecologic exam.  Current complaints: 1.  Noticed cyst on vulva left side 8 weeks ago- not painful, getting smaller   Gynecologic History Patient's last menstrual period was 12/27/2014. Contraception: vasectomy Last Pap: 12/30/2012 neg/neg. Results were: normal Last mammogram: n/a. Results were:   Obstetric History OB History  No data available    Past Medical History  Diagnosis Date  . Ulcerative colitis 2003    [facility] - [facility]  . History of chicken pox   . 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 History of rheumatic fever     treated, s/p preventative abx for years    Past Surgical History  Procedure Laterality Date  . Cholecystectomy  2005  . Dexa  06/2014    WNL  . Flexible sigmoidoscopy  06/2014    rectal colitis Servando Snare)    Current Outpatient Prescriptions on File Prior to Visit  Medication Sig Dispense Refill  . Ascorbic Acid (VITAMIN C) 1000 MG tablet Take 2,000 mg by mouth daily.    . B Complex-C (SUPER B COMPLEX PO) Take by mouth daily.    . cholecalciferol (VITAMIN D) 1000 UNITS tablet Take 1,000 Units by mouth daily.    . mesalamine (CANASA) 1000 MG suppository Place 1,000 mg rectally once a week. Fridays    . mesalamine (LIALDA) 1.2 G EC tablet Take 4 tablets (4.8 g total) by mouth daily. 4 tabs daily 120 tablet 5  . Multiple Vitamin (MULTIVITAMIN) tablet Take 1 tablet by mouth daily.    . Probiotic Product (ALIGN PO) Take 1 tablet by mouth daily.    Marland Kitchen zinc gluconate 50 MG tablet Take 50 mg by mouth daily.     No current facility-administered medications on file prior to visit.     Allergies  Allergen Reactions  . Penicillins Hives  . Sulfa Antibiotics Hives    History   Social History  . Marital Status: Married    Spouse Name: N/A  . Number of Children: 2  . Years of Education: N/A   Occupational History  . Not on file.   Social History Main Topics  . Smoking status: Never Smoker   . Smokeless tobacco: Never Used  . Alcohol Use: 0.0 oz/week    0 Standard drinks or equivalent per week     Comment: occassionally  . Drug Use: No  . Sexual Activity: Not on file   Other Topics Concern  . Not on file   Social History Narrative   Lives with husband and 2 children (2010, 2013), 2 dogs   Occupation: Education officer, environmental at [employer]   Edu: College   Activity: no regular exercise   Diet: good water, avoids high fiber 2/2 UC    Family History  Problem Relation Age of Onset  . Diabetes type I Brother 13  . Cancer Paternal Grandfather     esophageal (smoker)  . Cancer Other     breast (great grandmother)  . Cancer Other     colon (maternal great uncles)  . CAD Neg Hx   . Stroke Neg Hx   .  Hypertension Neg Hx     The following portions of the patient's history were reviewed and updated as appropriate: allergies, current medications, past family history, past medical history, past social history, past surgical history and problem list.  Review of Systems ROS Review of Systems - General ROS: negative for - chills, fatigue, fever, hot flashes, night sweats, weight gain or weight loss Psychological ROS: negative for - anxiety, decreased libido, depression, mood swings, physical abuse or sexual abuse Ophthalmic ROS: negative for - blurry vision, eye pain or loss of vision ENT ROS: negative for - headaches, hearing change, visual changes or vocal changes Allergy and Immunology ROS: negative for - hives, itchy/watery eyes or seasonal allergies Hematological and Lymphatic ROS: negative for - bleeding problems, bruising, swollen lymph nodes or weight  loss Endocrine ROS: negative for - galactorrhea, hair pattern changes, hot flashes, malaise/lethargy, mood swings, palpitations, polydipsia/polyuria, skin changes, temperature intolerance or unexpected weight changes Breast ROS: negative for - new or changing breast lumps or nipple discharge Respiratory ROS: negative for - cough or shortness of breath Cardiovascular ROS: negative for - chest pain, irregular heartbeat, palpitations or shortness of breath Gastrointestinal ROS: no abdominal pain, change in bowel habits, or black or bloody stools Genito-Urinary ROS: no dysuria, trouble voiding, or hematuria Musculoskeletal ROS: negative for - joint pain or joint stiffness Neurological ROS: negative for - bowel and bladder control changes Dermatological ROS: negative for rash and skin lesion changes   Objective:   LMP 12/27/2014 CONSTITUTIONAL: Well-developed, well-nourished female in no acute distress.  PSYCHIATRIC: Normal mood and affect. Normal behavior. Normal judgment and thought content. NEUROLGIC: Alert and oriented to person, place, and time. Normal muscle tone coordination. No cranial nerve deficit noted. HENT:  Normocephalic, atraumatic, External right and left ear normal. Oropharynx is clear and moist EYES: Conjunctivae and EOM are normal. Pupils are equal, round, and reactive to light. No scleral icterus.  NECK: Normal range of motion, supple, no masses.  Normal thyroid.  SKIN: Skin is warm and dry. No rash noted. Not diaphoretic. No erythema. No pallor. CARDIOVASCULAR: Normal heart rate noted, regular rhythm, no murmur. RESPIRATORY: Clear to auscultation bilaterally. Effort and breath sounds normal, no problems with respiration noted. BREASTS: Symmetric in size. No masses, skin changes, nipple drainage, or lymphadenopathy. ABDOMEN: Soft, normal bowel sounds, no distention noted.  No tenderness, rebound or guarding.  BLADDER: Normal PELVIC:  External genitalia: 1.5 cm vulvar cyst to  the left of introitus, mobile, nontender  BUS: Normal  Vagina: Normal  Cervix: Normal  Uterus: Normal  Adnexa: Normal  RV: External Exam NormaI  MUSCULOSKELETAL: Normal range of motion. No tenderness.  No cyanosis, clubbing, or edema.  2+ distal pulses. LYMPHATIC: No Axillary, Supraclavicular, or Inguinal Adenopathy.    Assessment:   Annual gynecologic examination 34 y.o. Contraception: vasectomy  Vulvar cyst, 1.5 cm, asymptomatic; no intervention necessary at this time Normal BMI Problem List Items Addressed This Visit    None      Plan:  Pap: Not needed Mammogram: n/a Stool Guaiac Testing:  Not Indicated Labs: thru pcp  Routine preventative health maintenance measures emphasized: Exercise/Diet/Weight control, Tobacco Warnings and Alcohol/Substance use risks  Return to Clinic - [address]   Crystal BertholdMiller, CMA   Herold HarmsMartin A Buddy Loeffelholz, MD

## 2015-01-11 NOTE — Patient Instructions (Signed)
1.  Return as needed if the vulvar cyst enlarges or becomes symptomatic. 2.  Routine wellness issues addressed.

## 2015-03-27 ENCOUNTER — Other Ambulatory Visit: Payer: Self-pay | Admitting: Family Medicine

## 2015-03-27 DIAGNOSIS — K5190 Ulcerative colitis, unspecified, without complications: Secondary | ICD-10-CM

## 2015-03-28 ENCOUNTER — Other Ambulatory Visit: Payer: 59

## 2015-03-29 ENCOUNTER — Other Ambulatory Visit (INDEPENDENT_AMBULATORY_CARE_PROVIDER_SITE_OTHER): Payer: Federal, State, Local not specified - PPO

## 2015-03-29 DIAGNOSIS — K5190 Ulcerative colitis, unspecified, without complications: Secondary | ICD-10-CM | POA: Diagnosis not present

## 2015-03-29 LAB — COMPREHENSIVE METABOLIC PANEL
ALK PHOS: 46 U/L (ref 39–117)
ALT: 13 U/L (ref 0–35)
AST: 18 U/L (ref 0–37)
Albumin: 4.4 g/dL (ref 3.5–5.2)
BILIRUBIN TOTAL: 0.7 mg/dL (ref 0.2–1.2)
BUN: 9 mg/dL (ref 6–23)
CALCIUM: 9.5 mg/dL (ref 8.4–10.5)
CO2: 29 mEq/L (ref 19–32)
Chloride: 103 mEq/L (ref 96–112)
Creatinine, Ser: 0.65 mg/dL (ref 0.40–1.20)
GFR: 109.57 mL/min (ref >60.00)
GLUCOSE: 97 mg/dL (ref 70–99)
Potassium: 3.9 mEq/L (ref 3.5–5.1)
Sodium: 138 mEq/L (ref 135–145)
TOTAL PROTEIN: 7.4 g/dL (ref 6.0–8.3)

## 2015-03-29 LAB — CBC WITH DIFFERENTIAL/PLATELET
BASOS ABS: 0.1 10*3/uL (ref 0.0–0.1)
Basophils Relative: 1.1 % (ref 0.0–3.0)
EOS ABS: 0.4 10*3/uL (ref 0.0–0.7)
Eosinophils Relative: 5.2 % — ABNORMAL HIGH (ref 0.0–5.0)
HEMATOCRIT: 39.2 % (ref 36.0–46.0)
Hemoglobin: 13.5 g/dL (ref 12.0–15.0)
LYMPHS PCT: 26.5 % (ref 12.0–46.0)
Lymphs Abs: 1.9 10*3/uL (ref 0.7–4.0)
MCHC: 34.4 g/dL (ref 30.0–36.0)
MCV: 90.4 fl (ref 78.0–100.0)
MONO ABS: 0.5 10*3/uL (ref 0.1–1.0)
Monocytes Relative: 7.2 % (ref 3.0–12.0)
NEUTROS ABS: 4.3 10*3/uL (ref 1.4–7.7)
Neutrophils Relative %: 60 % (ref 43.0–77.0)
PLATELETS: 352 10*3/uL (ref 150.0–400.0)
RBC: 4.34 Mil/uL (ref 3.87–5.11)
RDW: 12.6 % (ref 11.5–15.5)
WBC: 7.2 10*3/uL (ref 4.0–10.5)

## 2015-03-29 LAB — TSH: TSH: 1.2 u[IU]/mL (ref 0.35–4.50)

## 2015-04-04 ENCOUNTER — Encounter: Payer: 59 | Admitting: Family Medicine

## 2015-04-05 ENCOUNTER — Encounter: Payer: 59 | Admitting: Family Medicine

## 2015-04-08 ENCOUNTER — Encounter: Payer: Self-pay | Admitting: Family Medicine

## 2015-04-08 ENCOUNTER — Ambulatory Visit (INDEPENDENT_AMBULATORY_CARE_PROVIDER_SITE_OTHER): Payer: Federal, State, Local not specified - PPO | Admitting: Family Medicine

## 2015-04-08 VITALS — BP 108/68 | HR 80 | Temp 97.9°F | Ht 66.0 in | Wt 130.8 lb

## 2015-04-08 DIAGNOSIS — R7611 Nonspecific reaction to tuberculin skin test without active tuberculosis: Secondary | ICD-10-CM

## 2015-04-08 DIAGNOSIS — Z0000 Encounter for general adult medical examination without abnormal findings: Secondary | ICD-10-CM | POA: Diagnosis not present

## 2015-04-08 DIAGNOSIS — K5120 Ulcerative (chronic) proctitis without complications: Secondary | ICD-10-CM

## 2015-04-08 NOTE — Assessment & Plan Note (Signed)
Doing great. Follows regularly with GI.

## 2015-04-08 NOTE — Progress Notes (Signed)
BP 108/68 mmHg  Pulse 80  Temp(Src) 97.9 F (36.6 C) (Oral)  Ht  (1.676 m)  Wt 130 lb 12 oz (59.308 kg)  BMI 21.11 kg/m2  LMP 03/16/2015   CC: CPE  Subjective:    Patient ID: Mary Stephenson, female    DOB: Oct 12, 1980, 33 y.o.   MRN: [id number]  HPI: Mary Stephenson is a 34 y.o. female presenting on 04/08/2015 for Annual Exam   Recent TB seroconversion with pos quantiferon test - completed 4 mo rifampin.   Ulcerative colitis - followed by GI Dr Servando Snare. On mesalamine tablet, canasa suppository weekly, probiotic. Recent flex sig - stable. DEXA Date: 06/2014 WNL  Preventative: Well woman (12/2012) with GYN - Dr. Sharyl [facility]  Colonoscopy 2014, flex sig 06/2014  DEXA Date: 06/2014 WNL Flu shot - to get at work  Tdap - 2010  LMP - 03/09/2014, husband with vasectomy  Lives with husband and 2 children (2010, 2013), 2 dogs  Occupation: Education officer, environmental at [employer]  Edu: College  Activity: no regular exercise  Diet: good water, avoids high fiber 2/2 UC   Relevant past medical, surgical, family and social history reviewed and updated as indicated. Interim medical history since our last visit reviewed. Allergies and medications reviewed and updated. Current Outpatient Prescriptions on File Prior to Visit  Medication Sig  . Ascorbic Acid (VITAMIN C) 1000 MG tablet Take 2,000 mg by mouth daily.  . B Complex-C (SUPER B COMPLEX PO) Take by mouth daily.  . cholecalciferol (VITAMIN D) 1000 UNITS tablet Take 1,000 Units by mouth daily.  . mesalamine (CANASA) 1000 MG suppository Place 1,000 mg rectally once a week. Fridays  . mesalamine (LIALDA) 1.2 G EC tablet Take 4 tablets (4.8 g total) by mouth daily. 4 tabs daily  . Multiple Vitamin (MULTIVITAMIN) tablet Take 1 tablet by mouth daily.  . Probiotic Product (ALIGN PO) Take 1 tablet by mouth daily.  Marland Kitchen zinc gluconate 50 MG tablet Take 50 mg by mouth daily.   No current facility-administered  medications on file prior to visit.    Review of Systems  Constitutional: Negative for fever, chills, activity change, appetite change, fatigue and unexpected weight change.  HENT: Negative for hearing loss.   Eyes: Negative for visual disturbance.  Respiratory: Negative for cough, chest tightness, shortness of breath and wheezing.   Cardiovascular: Negative for chest pain, palpitations and leg swelling.  Gastrointestinal: Negative for nausea, vomiting, abdominal pain, diarrhea, constipation, blood in stool and abdominal distention.  Genitourinary: Negative for hematuria and difficulty urinating.  Musculoskeletal: Negative for myalgias, arthralgias and neck pain.  Skin: Negative for rash.  Neurological: Negative for dizziness, seizures, syncope and headaches.  Hematological: Negative for adenopathy. Does not bruise/bleed easily.  Psychiatric/Behavioral: Negative for dysphoric mood. The patient is not nervous/anxious.    Per HPI unless specifically indicated above     Objective:    BP 108/68 mmHg  Pulse 80  Temp(Src) 97.9 F (36.6 C) (Oral)  Ht  (1.676 m)  Wt 130 lb 12 oz (59.308 kg)  BMI 21.11 kg/m2  LMP 03/16/2015  Wt Readings from Last 3 Encounters:  04/08/15 130 lb 12 oz (59.308 kg)  01/11/15 128 lb 8 oz (58.287 kg)  12/28/14 129 lb 6.4 oz (58.695 kg)   Physical Exam  Constitutional: She is oriented to person, place, and time. She appears well-developed and well-nourished. No distress.  HENT:  Head: Normocephalic and atraumatic.  Right Ear: Hearing, tympanic membrane, external ear and  ear canal normal.  Left Ear: Hearing, tympanic membrane, external ear and ear canal normal.  Nose: Nose normal.  Mouth/Throat: Uvula is midline, oropharynx is clear and moist and mucous membranes are normal. No oropharyngeal exudate, posterior oropharyngeal edema or posterior oropharyngeal erythema.  Eyes: Conjunctivae and EOM are normal. Pupils are equal, round, and reactive to light.  No scleral icterus.  Neck: Normal range of motion. Neck supple. Carotid bruit is not present. No thyromegaly present.  Cardiovascular: Normal rate, regular rhythm, normal heart sounds and intact distal pulses.   No murmur heard. Pulses:      Radial pulses are 2+ on the right side, and 2+ on the left side.  Pulmonary/Chest: Effort normal and breath sounds normal. No respiratory distress. She has no wheezes. She has no rales.  Abdominal: Soft. Bowel sounds are normal. She exhibits no distension and no mass. There is no tenderness. There is no rebound and no guarding.  Musculoskeletal: Normal range of motion. She exhibits no edema.  Lymphadenopathy:    She has no cervical adenopathy.  Neurological: She is alert and oriented to person, place, and time.  CN grossly intact, station and gait intact  Skin: Skin is warm and dry. No rash noted.  Psychiatric: She has a normal mood and affect. Her behavior is normal. Judgment and thought content normal.  Nursing note and vitals reviewed.  Results for orders placed or performed in visit on 03/29/15  Comprehensive metabolic panel  Result Value Ref Range   Sodium 138 135 - 145 mEq/L   Potassium 3.9 3.5 - 5.1 mEq/L   Chloride 103 96 - 112 mEq/L   CO2 29 19 - 32 mEq/L   Glucose, Bld 97 70 - 99 mg/dL   BUN 9 6 - 23 mg/dL   Creatinine, Ser 1.61 0.40 - 1.20 mg/dL   Total Bilirubin 0.7 0.2 - 1.2 mg/dL   Alkaline Phosphatase 46 39 - 117 U/L   AST 18 0 - 37 U/L   ALT 13 0 - 35 U/L   Total Protein 7.4 6.0 - 8.3 g/dL   Albumin 4.4 3.5 - 5.2 g/dL   Calcium 9.5 8.4 - 09.6 mg/dL   GFR 045.40 >98.11 mL/min  TSH  Result Value Ref Range   TSH 1.20 0.35 - 4.50 uIU/mL  CBC with Differential/Platelet  Result Value Ref Range   WBC 7.2 4.0 - 10.5 K/uL   RBC 4.34 3.87 - 5.11 Mil/uL   Hemoglobin 13.5 12.0 - 15.0 g/dL   HCT 91.4 78.2 - 95.6 %   MCV 90.4 78.0 - 100.0 fl   MCHC 34.4 30.0 - 36.0 g/dL   RDW 21.3 08.6 - 57.8 %   Platelets 352.0 150.0 - 400.0 K/uL    Neutrophils Relative % 60.0 43.0 - 77.0 %   Lymphocytes Relative 26.5 12.0 - 46.0 %   Monocytes Relative 7.2 3.0 - 12.0 %   Eosinophils Relative 5.2 (H) 0.0 - 5.0 %   Basophils Relative 1.1 0.0 - 3.0 %   Neutro Abs 4.3 1.4 - 7.7 K/uL   Lymphs Abs 1.9 0.7 - 4.0 K/uL   Monocytes Absolute 0.5 0.1 - 1.0 K/uL   Eosinophils Absolute 0.4 0.0 - 0.7 K/uL   Basophils Absolute 0.1 0.0 - 0.1 K/uL      Assessment & Plan:   Problem List Items Addressed This Visit    Ulcerative colitis (HCC)    Doing great. Follows regularly with GI.       Positive TB test  Completed treatment 2015.      Health maintenance examination - Primary    Preventative protocols reviewed and updated unless pt declined. Discussed healthy diet and lifestyle.           Follow up plan: Return in about 1 year (around 04/07/2016), or as needed, for annual exam, prior fasting for blood work.

## 2015-04-08 NOTE — Patient Instructions (Addendum)
You are doing great today! Return as needed or in 1 year for next physical.  Health Maintenance, Female Adopting a healthy lifestyle and getting preventive care can go a long way to promote health and wellness. Talk with your health care provider about what schedule of regular examinations is right for you. This is a good chance for you to check in with your provider about disease prevention and staying healthy. In between checkups, there are plenty of things you can do on your own. Experts have done a lot of research about which lifestyle changes and preventive measures are most likely to keep you healthy. Ask your health care provider for more information. WEIGHT AND DIET  Eat a healthy diet  Be sure to include plenty of vegetables, fruits, low-fat dairy products, and lean protein.  Do not eat a lot of foods high in solid fats, added sugars, or salt.  Get regular exercise. This is one of the most important things you can do for your health.  Most adults should exercise for at least 150 minutes each week. The exercise should increase your heart rate and make you sweat (moderate-intensity exercise).  Most adults should also do strengthening exercises at least twice a week. This is in addition to the moderate-intensity exercise.  Maintain a healthy weight  Body mass index (BMI) is a measurement that can be used to identify possible weight problems. It estimates body fat based on height and weight. Your health care provider can help determine your BMI and help you achieve or maintain a healthy weight.  For females 20 years of age and older:   A BMI below 18.5 is considered underweight.  A BMI of 18.5 to 24.9 is normal.  A BMI of 25 to 29.9 is considered overweight.  A BMI of 30 and above is considered obese.  Watch levels of cholesterol and blood lipids  You should start having your blood tested for lipids and cholesterol at 36 years of age, then have this test every 5 years.  You  may need to have your cholesterol levels checked more often if:  Your lipid or cholesterol levels are high.  You are older than 36 years of age.  You are at high risk for heart disease.  CANCER SCREENING   Lung Cancer  Lung cancer screening is recommended for adults 55-80 years old who are at high risk for lung cancer because of a history of smoking.  A yearly low-dose CT scan of the lungs is recommended for people who:  Currently smoke.  Have quit within the past 15 years.  Have at least a 30-pack-year history of smoking. A pack year is smoking an average of one pack of cigarettes a day for 1 year.  Yearly screening should continue until it has been 15 years since you quit.  Yearly screening should stop if you develop a health problem that would prevent you from having lung cancer treatment.  Breast Cancer  Practice breast self-awareness. This means understanding how your breasts normally appear and feel.  It also means doing regular breast self-exams. Let your health care provider know about any changes, no matter how small.  If you are in your 20s or 30s, you should have a clinical breast exam (CBE) by a health care provider every 1-3 years as part of a regular health exam.  If you are 40 or older, have a CBE every year. Also consider having a breast X-ray (mammogram) every year.  If you have a family   history of breast cancer, talk to your health care provider about genetic screening.  If you are at high risk for breast cancer, talk to your health care provider about having an MRI and a mammogram every year.  Breast cancer gene (BRCA) assessment is recommended for women who have family members with BRCA-related cancers. BRCA-related cancers include:  Breast.  Ovarian.  Tubal.  Peritoneal cancers.  Results of the assessment will determine the need for genetic counseling and BRCA1 and BRCA2 testing. Cervical Cancer Your health care provider may recommend that you  be screened regularly for cancer of the pelvic organs (ovaries, uterus, and vagina). This screening involves a pelvic examination, including checking for microscopic changes to the surface of your cervix (Pap test). You may be encouraged to have this screening done every 3 years, beginning at age 61.  For women ages 47-65, health care providers may recommend pelvic exams and Pap testing every 3 years, or they may recommend the Pap and pelvic exam, combined with testing for human papilloma virus (HPV), every 5 years. Some types of HPV increase your risk of cervical cancer. Testing for HPV may also be done on women of any age with unclear Pap test results.  Other health care providers may not recommend any screening for nonpregnant women who are considered low risk for pelvic cancer and who do not have symptoms. Ask your health care provider if a screening pelvic exam is right for you.  If you have had past treatment for cervical cancer or a condition that could lead to cancer, you need Pap tests and screening for cancer for at least 20 years after your treatment. If Pap tests have been discontinued, your risk factors (such as having a new sexual partner) need to be reassessed to determine if screening should resume. Some women have medical problems that increase the chance of getting cervical cancer. In these cases, your health care provider may recommend more frequent screening and Pap tests. Colorectal Cancer  This type of cancer can be detected and often prevented.  Routine colorectal cancer screening usually begins at 36 years of age and continues through 36 years of age.  Your health care provider may recommend screening at an earlier age if you have risk factors for colon cancer.  Your health care provider may also recommend using home test kits to check for hidden blood in the stool.  A small camera at the end of a tube can be used to examine your colon directly (sigmoidoscopy or colonoscopy).  This is done to check for the earliest forms of colorectal cancer.  Routine screening usually begins at age 30.  Direct examination of the colon should be repeated every 5-10 years through 36 years of age. However, you may need to be screened more often if early forms of precancerous polyps or small growths are found. Skin Cancer  Check your skin from head to toe regularly.  Tell your health care provider about any new moles or changes in moles, especially if there is a change in a mole's shape or color.  Also tell your health care provider if you have a mole that is larger than the size of a pencil eraser.  Always use sunscreen. Apply sunscreen liberally and repeatedly throughout the day.  Protect yourself by wearing long sleeves, pants, a wide-brimmed hat, and sunglasses whenever you are outside. HEART DISEASE, DIABETES, AND HIGH BLOOD PRESSURE   High blood pressure causes heart disease and increases the risk of stroke. High blood pressure  is more likely to develop in:  People who have blood pressure in the high end of the normal range (130-139/85-89 mm Hg).  People who are overweight or obese.  People who are African American.  If you are 105-22 years of age, have your blood pressure checked every 3-5 years. If you are 70 years of age or older, have your blood pressure checked every year. You should have your blood pressure measured twice--once when you are at a hospital or clinic, and once when you are not at a hospital or clinic. Record the average of the two measurements. To check your blood pressure when you are not at a hospital or clinic, you can use:  An automated blood pressure machine at a pharmacy.  A home blood pressure monitor.  If you are between 41 years and 15 years old, ask your health care provider if you should take aspirin to prevent strokes.  Have regular diabetes screenings. This involves taking a blood sample to check your fasting blood sugar level.  If you  are at a normal weight and have a low risk for diabetes, have this test once every three years after 36 years of age.  If you are overweight and have a high risk for diabetes, consider being tested at a younger age or more often. PREVENTING INFECTION  Hepatitis B  If you have a higher risk for hepatitis B, you should be screened for this virus. You are considered at high risk for hepatitis B if:  You were born in a country where hepatitis B is common. Ask your health care provider which countries are considered high risk.  Your parents were born in a high-risk country, and you have not been immunized against hepatitis B (hepatitis B vaccine).  You have HIV or AIDS.  You use needles to inject street drugs.  You live with someone who has hepatitis B.  You have had sex with someone who has hepatitis B.  You get hemodialysis treatment.  You take certain medicines for conditions, including cancer, organ transplantation, and autoimmune conditions. Hepatitis C  Blood testing is recommended for:  Everyone born from [birth year] through 1967.  Anyone with known risk factors for hepatitis C. Sexually transmitted infections (STIs)  You should be screened for sexually transmitted infections (STIs) including gonorrhea and chlamydia if:  You are sexually active and are younger than 36 years of age.  You are older than 36 years of age and your health care provider tells you that you are at risk for this type of infection.  Your sexual activity has changed since you were last screened and you are at an increased risk for chlamydia or gonorrhea. Ask your health care provider if you are at risk.  If you do not have HIV, but are at risk, it may be recommended that you take a prescription medicine daily to prevent HIV infection. This is called pre-exposure prophylaxis (PrEP). You are considered at risk if:  You are sexually active and do not regularly use condoms or know the HIV status of your  partner(s).  You take drugs by injection.  You are sexually active with a partner who has HIV. Talk with your health care provider about whether you are at high risk of being infected with HIV. If you choose to begin PrEP, you should first be tested for HIV. You should then be tested every 3 months for as long as you are taking PrEP.  PREGNANCY   If you are premenopausal and  you may become pregnant, ask your health care provider about preconception counseling.  If you may become pregnant, take 400 to 800 micrograms (mcg) of folic acid every day.  If you want to prevent pregnancy, talk to your health care provider about birth control (contraception). OSTEOPOROSIS AND MENOPAUSE   Osteoporosis is a disease in which the bones lose minerals and strength with aging. This can result in serious bone fractures. Your risk for osteoporosis can be identified using a bone density scan.  If you are 53 years of age or older, or if you are at risk for osteoporosis and fractures, ask your health care provider if you should be screened.  Ask your health care provider whether you should take a calcium or vitamin D supplement to lower your risk for osteoporosis.  Menopause may have certain physical symptoms and risks.  Hormone replacement therapy may reduce some of these symptoms and risks. Talk to your health care provider about whether hormone replacement therapy is right for you.  HOME CARE INSTRUCTIONS   Schedule regular health, dental, and eye exams.  Stay current with your immunizations.   Do not use any tobacco products including cigarettes, chewing tobacco, or electronic cigarettes.  If you are pregnant, do not drink alcohol.  If you are breastfeeding, limit how much and how often you drink alcohol.  Limit alcohol intake to no more than 1 drink per day for nonpregnant women. One drink equals 12 ounces of beer, 5 ounces of wine, or 1 ounces of hard liquor.  Do not use street drugs.  Do  not share needles.  Ask your health care provider for help if you need support or information about quitting drugs.  Tell your health care provider if you often feel depressed.  Tell your health care provider if you have ever been abused or do not feel safe at home.   This information is not intended to replace advice given to you by your health care provider. Make sure you discuss any questions you have with your health care provider.   Document Released: 01/01/2011 Document Revised: 07/09/2014 Document Reviewed: 05/20/2013 Elsevier Interactive Patient Education [employer].

## 2015-04-08 NOTE — Assessment & Plan Note (Addendum)
Completed treatment 2015.

## 2015-04-08 NOTE — Progress Notes (Signed)
Pre visit review using our clinic review tool, if applicable. No additional management support is needed unless otherwise documented below in the visit note. 

## 2015-04-08 NOTE — Assessment & Plan Note (Signed)
Preventative protocols reviewed and updated unless pt declined. Discussed healthy diet and lifestyle.  

## 2015-06-21 ENCOUNTER — Other Ambulatory Visit: Payer: Self-pay

## 2015-06-28 ENCOUNTER — Ambulatory Visit (INDEPENDENT_AMBULATORY_CARE_PROVIDER_SITE_OTHER): Payer: Federal, State, Local not specified - PPO | Admitting: Gastroenterology

## 2015-06-28 ENCOUNTER — Ambulatory Visit: Payer: Federal, State, Local not specified - PPO | Admitting: Gastroenterology

## 2015-06-28 ENCOUNTER — Encounter: Payer: Self-pay | Admitting: Gastroenterology

## 2015-06-28 ENCOUNTER — Other Ambulatory Visit: Payer: Self-pay

## 2015-06-28 VITALS — BP 138/77 | HR 91 | Temp 98.1°F | Ht 66.0 in | Wt 133.6 lb

## 2015-06-28 DIAGNOSIS — K5120 Ulcerative (chronic) proctitis without complications: Secondary | ICD-10-CM

## 2015-06-28 DIAGNOSIS — K5130 Ulcerative (chronic) rectosigmoiditis without complications: Secondary | ICD-10-CM

## 2015-06-28 NOTE — Progress Notes (Signed)
Primary Care Physician: Eustaquio Boyden, MD  Primary Gastroenterologist:  Dr. Midge Minium  Chief Complaint  Patient presents with  . Follow up ulcerative colitis    HPI: Mary Stephenson is a 34 y.o. female here for follow-up of ulcerative proctitis. The patient had a flexible sigmoidoscopy last year and a full colonoscopy or before that. The patient has been doing well without any complaints except for some intermittent rectal bleeding. The patient states she takes the [facility] whenever she has the rectal bleeding and mucus and it goes away readily. She denies any unexplained weight loss fevers chills nausea or vomiting. The patient reports that she may be moving to [place] soon.  Current Outpatient Prescriptions  Medication Sig Dispense Refill  . Ascorbic Acid (VITAMIN C) 1000 MG tablet Take 2,000 mg by mouth daily.    . Azelaic Acid (FINACEA) 15 % cream Apply as directed    . B Complex-C (SUPER B COMPLEX PO) Take by mouth daily.    . cholecalciferol (VITAMIN D) 1000 UNITS tablet Take 1,000 Units by mouth daily.    . Dapsone (ACZONE) 5 % topical gel Apply as directed    . mesalamine (CANASA) 1000 MG suppository Place 1,000 mg rectally once a week. Fridays    . mesalamine (LIALDA) 1.2 G EC tablet Take 4 tablets (4.8 g total) by mouth daily. 4 tabs daily 120 tablet 5  . Multiple Vitamin (MULTIVITAMIN) tablet Take 1 tablet by mouth daily.    . Probiotic Product (ALIGN PO) Take 1 tablet by mouth daily.    Marland Kitchen zinc gluconate 50 MG tablet Take 50 mg by mouth daily.     No current facility-administered medications for this visit.    Allergies as of 06/28/2015 - Review Complete 06/28/2015  Allergen Reaction Noted  . Penicillins Hives 03/17/2013  . Sulfa antibiotics Hives 03/17/2013    ROS:  General: Negative for anorexia, weight loss, fever, chills, fatigue, weakness. ENT: Negative for hoarseness, difficulty swallowing , nasal congestion. CV: Negative for chest pain, angina,  palpitations, dyspnea on exertion, peripheral edema.  Respiratory: Negative for dyspnea at rest, dyspnea on exertion, cough, sputum, wheezing.  GI: See history of present illness. GU:  Negative for dysuria, hematuria, urinary incontinence, urinary frequency, nocturnal urination.  Endo: Negative for unusual weight change.    Physical Examination:   BP 138/77 mmHg  Pulse 91  Temp(Src) 98.1 F (36.7 C) (Oral)  Ht  (1.676 m)  Wt 133 lb 9.6 oz (60.601 kg)  BMI 21.57 kg/m2  General: Well-nourished, well-developed in no acute distress.  Eyes: No icterus. Conjunctivae pink. Mouth: Oropharyngeal mucosa moist and pink , no lesions erythema or exudate. Lungs: Clear to auscultation bilaterally. Non-labored. Heart: Regular rate and rhythm, no murmurs rubs or gallops.  Abdomen: Bowel sounds are normal, nontender, nondistended, no hepatosplenomegaly or masses, no abdominal bruits or hernia , no rebound or guarding.   Extremities: No lower extremity edema. No clubbing or deformities. Neuro: Alert and oriented x 3.  Grossly intact. Skin: Warm and dry, no jaundice.   Psych: Alert and cooperative, normal mood and affect.  Labs:    Imaging Studies: No results found.  Assessment and Plan:   Mary Stephenson is a 65 y.o. y/o female who comes for follow-up of left-sided ulcerative colitis. The patient is not due for another colonoscopy until next year. The patient will be moving to [place]. The patient will stay on her present medications. She has also been given the name of a gastrologist  sign no in the area name Mary Stephenson. She has been told to follow up with him and she moves there. The patient has been explained the plan and agrees with it.   Note: This dictation was prepared with Dragon dictation along with smaller phrase technology. Any transcriptional errors that result from this process are unintentional.

## 2015-07-07 ENCOUNTER — Other Ambulatory Visit: Payer: Self-pay | Admitting: Obstetrics and Gynecology

## 2015-07-07 DIAGNOSIS — Z1231 Encounter for screening mammogram for malignant neoplasm of breast: Secondary | ICD-10-CM

## 2015-07-11 ENCOUNTER — Other Ambulatory Visit: Payer: Self-pay | Admitting: Obstetrics and Gynecology

## 2015-07-11 ENCOUNTER — Ambulatory Visit
Admission: RE | Admit: 2015-07-11 | Discharge: 2015-07-11 | Disposition: A | Discharge status: Home / Self Care | Payer: Federal, State, Local not specified - PPO | Source: Ambulatory Visit | Attending: Obstetrics and Gynecology | Admitting: Obstetrics and Gynecology

## 2015-07-11 DIAGNOSIS — Z1231 Encounter for screening mammogram for malignant neoplasm of breast: Secondary | ICD-10-CM | POA: Diagnosis present

## 2015-07-18 ENCOUNTER — Ambulatory Visit: Admission: RE | Admit: 2015-07-18 | Payer: Federal, State, Local not specified - PPO | Source: Ambulatory Visit

## 2015-10-04 ENCOUNTER — Other Ambulatory Visit: Payer: Self-pay

## 2015-10-04 DIAGNOSIS — K51219 Ulcerative (chronic) proctitis with unspecified complications: Secondary | ICD-10-CM

## 2015-10-04 MED ORDER — MESALAMINE 1000 MG RE SUPP: 1000.0000 mg | RECTAL | Status: DC

## 2015-10-04 MED ORDER — MESALAMINE 1.2 G PO TBEC: 4.8000 g | DELAYED_RELEASE_TABLET | Freq: Every day | ORAL | Status: AC

## 2015-10-12 ENCOUNTER — Other Ambulatory Visit: Payer: Self-pay

## 2015-10-12 ENCOUNTER — Telehealth: Payer: Self-pay | Admitting: Gastroenterology

## 2015-10-12 DIAGNOSIS — K51219 Ulcerative (chronic) proctitis with unspecified complications: Secondary | ICD-10-CM

## 2015-10-12 MED ORDER — MESALAMINE 1000 MG RE SUPP: 1000.0000 mg | Freq: Every day | RECTAL | Status: AC

## 2015-10-12 NOTE — Telephone Encounter (Signed)
LVM for pt to return my call.

## 2015-10-12 NOTE — Telephone Encounter (Signed)
Pt requested a refill on Canasa. Pt is having to use it a couple of times weekly instead of once a week. Advised her this is normally done once daily for 3 to 6 weeks. After that we need to consult with Dr. Servando SnareWohl.

## 2015-10-12 NOTE — Telephone Encounter (Signed)
Ginger, please call patient. She would like to speak with you about one of the medications that Dr Servando SnareWohl prescribed. She left a voicemail and did not specify which medication it was.

## 2015-11-29 ENCOUNTER — Encounter: Payer: Self-pay | Admitting: Obstetrics and Gynecology

## 2015-12-01 ENCOUNTER — Ambulatory Visit (INDEPENDENT_AMBULATORY_CARE_PROVIDER_SITE_OTHER): Admitting: Obstetrics and Gynecology

## 2015-12-01 ENCOUNTER — Encounter: Payer: Self-pay | Admitting: Obstetrics and Gynecology

## 2015-12-01 ENCOUNTER — Telehealth: Payer: Self-pay | Admitting: Gastroenterology

## 2015-12-01 ENCOUNTER — Other Ambulatory Visit: Payer: Self-pay

## 2015-12-01 VITALS — BP 98/66 | HR 98 | Ht 67.0 in | Wt 124.3 lb

## 2015-12-01 DIAGNOSIS — N907 Vulvar cyst: Secondary | ICD-10-CM | POA: Diagnosis not present

## 2015-12-01 DIAGNOSIS — K589 Irritable bowel syndrome without diarrhea: Secondary | ICD-10-CM

## 2015-12-01 MED ORDER — ALIGN PO CAPS: 1.0000 | ORAL_CAPSULE | Freq: Every day | ORAL | Status: AC

## 2015-12-01 NOTE — Patient Instructions (Signed)
1. Same day surgery-excision of vulvar cyst 2 scheduled for 12/04/2015

## 2015-12-01 NOTE — Telephone Encounter (Signed)
Rx for Align sent to [facility] pharmacy per pt request.

## 2015-12-01 NOTE — Telephone Encounter (Signed)
Patient would like to know if she can have a prescription for Align Probiotic so she can use her flexible spending account at the pharmacy. Please call and advise.

## 2015-12-02 ENCOUNTER — Encounter: Payer: Self-pay | Admitting: *Deleted

## 2015-12-02 ENCOUNTER — Inpatient Hospital Stay: Admission: RE | Admit: 2015-12-02 | Source: Ambulatory Visit

## 2015-12-02 NOTE — Patient Instructions (Signed)
  Your procedure is scheduled on: 12-05-15  Report to [facility] 2ND FLOOR. To find out your arrival time please call [phone] between 1PM - 3PM on 12-02-15  Remember: Instructions that are not followed completely may result in serious medical risk, up to and including death, or upon the discretion of your surgeon and anesthesiologist your surgery may need to be rescheduled.    _X___ 1. Do not eat food or drink liquids after midnight. No gum chewing or hard candies.     _X___ 2. No Alcohol for 24 hours before or after surgery.   ____ 3. Bring all medications with you on the day of surgery if instructed.    ____ 4. Notify your doctor if there is any change in your medical condition     (cold, fever, infections).     Do not wear jewelry, make-up, hairpins, clips or nail polish.  Do not wear lotions, powders, or perfumes. You may wear deodorant.  Do not shave 48 hours prior to surgery. Men may shave face and neck.  Do not bring valuables to the hospital.    [facility] is not responsible for any belongings or valuables.               Contacts, dentures or bridgework may not be worn into surgery.  Leave your suitcase in the car. After surgery it may be brought to your room.  For patients admitted to the hospital, discharge time is determined by your treatment team.   Patients discharged the day of surgery will not be allowed to drive home.   Please read over the following fact sheets that you were given:     ____ Take these medicines the morning of surgery with A SIP OF WATER:    1. NONE  2.   3.   4.  5.  6.  ____ Fleet Enema (as directed)   ____ Use CHG Soap as directed  ____ Use inhalers on the day of surgery  ____ Stop metformin 2 days prior to surgery    ____ Take 1/2 of usual insulin dose the night before surgery and none on the morning of surgery.   ____ Stop Coumadin/Plavix/aspirin-N/A  __X__ Stop Anti-inflammatories-NO NSAIDS OR ASA  PRODUCTS-TYLENOL OK TO TAKE   __X__ Stop supplements until after surgery-STOP VIT C, B COMPLEX AND ALIGN NOW   ____ Bring C-Pap to the hospital.

## 2015-12-02 NOTE — Progress Notes (Signed)
Chief complaint: 1. Vulvar cysts  Patient presents for evaluation of 2 left labia majora cysts that have been progressively becoming more symptomatic. She is having discomfort and would like to have them excised.  Past medical history, past surgical history, problem list, medications, and allergies are reviewed  OBJECTIVE: BP 98/66 mmHg  Pulse 98  Ht 5\' 7"  (1.702 m)  Wt 124 lb 4.8 oz (56.382 kg)  BMI 19.46 kg/m2  LMP 11/11/2015 Pleasant white female in no acute distress Pelvic exam: External genitalia-left labia majora notable for 2 cystic lesions, each measuring approximately 2 cm; lesions are mobile, not indurated, and minimally tender. Right labia majora normal  ASSESSMENT:  1. Symptomatic left labia majora cyst 2  PLAN: 1. Excision in same day surgery. Date of surgery 12/04/2015 2. Preop H&P is completed today.  Return one week postoperatively for reassessment.  A total of 15 minutes were spent face-to-face with the patient during this encounter and over half of that time dealt with counseling and coordination of care.  Herold HarmsMartin A Millette Halberstam, MD  Note: This dictation was prepared with Dragon dictation along with smaller phrase technology. Any transcriptional errors that result from this process are unintentional.

## 2015-12-02 NOTE — H&P (Signed)
Subjective:    Patient is a 35 y.o. [id number] scheduled for Excision of vulvar lesions, symptomatic. The patient has 2 left labia majora cyst which are symptomatic with discomfort; they are not draining, nonerythematous, mobile.   Pertinent Gynecological History: No history of abnormal Pap smears. Cycles are regular. Contraception-OCPs  Menstrual History: OB History    Gravida Para Term Preterm AB TAB SAB Ectopic Multiple Living   2 2 2       2       Menarche age: NA Patient's last menstrual period was 11/11/2015.    Past Medical History  Diagnosis Date  . Ulcerative colitis (HCC) 2003    Lorenza BurtonKandice Jones Michel[facility]  . History of chicken pox   . 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 History of rheumatic fever     treated, s/p preventative abx for years    Past Surgical History  Procedure Laterality Date  . Cholecystectomy  2005  . Dexa  06/2014    WNL  . Flexible sigmoidoscopy  06/2014    rectal colitis ([facility])  . Colonoscopy  2014  . Lasik      OB History  Gravida Para Term Preterm AB SAB TAB Ectopic Multiple Living  2 2 2       2     # Outcome Date GA Lbr Len/2nd Weight Sex Delivery Anes PTL Lv  2 Term 2013   6 lb 1.9 oz (2.776 kg) M Vag-Spont   Y  1 Term 2010   7 lb 9.6 oz (3.447 kg) M Vag-Spont   Y      Social History   Social History  . Marital Status: Married    Spouse Name: N/A  . Number of Children: 2  . Years of Education: N/A   Social History Main Topics  . Smoking status: Never Smoker   . Smokeless tobacco: Never Used  . Alcohol Use: 0.0 oz/week    0 Standard drinks or equivalent per week     Comment: RARE  . Drug Use: No  . Sexual Activity: Yes     Comment: vasectomy   Other Topics Concern  . None   Social History Narrative   Lives with husband and 2 children (2010, 2013), 2 dogs   Occupation: Education officer, environmentalsonographer at [employer]   Edu: College   Activity: no regular exercise   Diet: good water, avoids high fiber 2/2 UC    Family History  Problem Relation Age of Onset  . Diabetes type I Brother 13  . Cancer Paternal Grandfather     esophageal (smoker)  . Cancer Other 80    breast (great grandmother)  . Cancer Other     colon (maternal great uncles)  . CAD Neg Hx   . Stroke Neg Hx   . Hypertension Neg Hx   . Breast cancer Maternal Grandmother      (Not in a hospital admission)  Allergies  Allergen Reactions  . Penicillins Hives  . Sulfa Antibiotics Hives    Review of Systems Constitutional: No recent fever/chills/sweats Respiratory: No recent cough/bronchitis Cardiovascular: No chest pain Gastrointestinal: No recent nausea/vomiting/diarrhea Genitourinary: No UTI symptoms Hematologic/lymphatic:No history of coagulopathy or recent blood thinner use    Objective:    BP 98/66 mmHg  Pulse 98  Ht 5\' 7"  (1.702 m)  Wt 124 lb 4.8 oz (56.382 kg)  BMI 19.46 kg/m2  LMP 11/11/2015  General:   Normal  Skin:   normal  HEENT:  Normal  Neck:  Supple without Adenopathy or Thyromegaly  Lungs:   Heart:              Breasts:   Abdomen:  Pelvis:  M/S   Extremeties:  Neuro:    clear to auscultation bilaterally   Normal without murmur   Not Examined   soft, non-tender; bowel sounds normal; no masses,  no organomegaly   Exam deferred to OR  No CVAT  Warm/Dry   Normal       12/01/2015 Pelvic: Left labia majora with 2 cystic lesions, each measuring approximately 2 cm in diameter.  Assessment:    1. Left labia majora cysts 2   Plan:  Excision of left labia majora cysts  Preoperative counseling: The patient is to undergo excision of left labia majora cysts on 12/04/2015. She is understanding of the planned procedure and is aware of and is accepting of all surgical risks which include but are not limited to bleeding, infection, pelvic organ injury with need for repair, blood clot disorders,  anesthesia risks, etc. All questions have been answered. Informed consent is given. Patient is ready and willing to proceed with surgery as scheduled.  Herold Harms, MD  Note: This dictation was prepared with Dragon dictation along with smaller phrase technology. Any transcriptional errors that result from this process are unintentional.

## 2015-12-05 ENCOUNTER — Encounter
Admission: RE | Disposition: A | Discharge status: Home / Self Care | Payer: Self-pay | Source: Ambulatory Visit | Attending: Obstetrics and Gynecology

## 2015-12-05 ENCOUNTER — Ambulatory Visit
Admission: RE | Admit: 2015-12-05 | Discharge: 2015-12-05 | Disposition: A | Discharge status: Home / Self Care | Source: Ambulatory Visit | Attending: Obstetrics and Gynecology | Admitting: Obstetrics and Gynecology

## 2015-12-05 ENCOUNTER — Encounter: Payer: Self-pay | Admitting: Anesthesiology

## 2015-12-05 ENCOUNTER — Ambulatory Visit: Admitting: Anesthesiology

## 2015-12-05 DIAGNOSIS — Z8619 Personal history of other infectious and parasitic diseases: Secondary | ICD-10-CM | POA: Insufficient documentation

## 2015-12-05 DIAGNOSIS — Z9889 Other specified postprocedural states: Secondary | ICD-10-CM | POA: Insufficient documentation

## 2015-12-05 DIAGNOSIS — Z9049 Acquired absence of other specified parts of digestive tract: Secondary | ICD-10-CM | POA: Diagnosis not present

## 2015-12-05 DIAGNOSIS — Z8249 Family history of ischemic heart disease and other diseases of the circulatory system: Secondary | ICD-10-CM | POA: Insufficient documentation

## 2015-12-05 DIAGNOSIS — Z833 Family history of diabetes mellitus: Secondary | ICD-10-CM | POA: Insufficient documentation

## 2015-12-05 DIAGNOSIS — N9089 Other specified noninflammatory disorders of vulva and perineum: Secondary | ICD-10-CM

## 2015-12-05 DIAGNOSIS — N907 Vulvar cyst: Secondary | ICD-10-CM | POA: Diagnosis not present

## 2015-12-05 DIAGNOSIS — Z880 Allergy status to penicillin: Secondary | ICD-10-CM | POA: Insufficient documentation

## 2015-12-05 DIAGNOSIS — Z882 Allergy status to sulfonamides status: Secondary | ICD-10-CM | POA: Diagnosis not present

## 2015-12-05 DIAGNOSIS — Z800 Family history of malignant neoplasm of digestive organs: Secondary | ICD-10-CM | POA: Insufficient documentation

## 2015-12-05 HISTORY — PX: VULVAR LESION REMOVAL: SHX5391

## 2015-12-05 LAB — CBC WITH DIFFERENTIAL/PLATELET
BASOS ABS: 0.1 10*3/uL (ref 0–0.1)
Basophils Relative: 1 %
EOS ABS: 0.4 10*3/uL (ref 0–0.7)
Eosinophils Relative: 5 %
HCT: 37.5 % (ref 35.0–47.0)
HEMOGLOBIN: 12.9 g/dL (ref 12.0–16.0)
LYMPHS ABS: 2.1 10*3/uL (ref 1.0–3.6)
Lymphocytes Relative: 22 %
MCH: 30.9 pg (ref 26.0–34.0)
MCHC: 34.4 g/dL (ref 32.0–36.0)
MCV: 89.6 fL (ref 80.0–100.0)
Monocytes Absolute: 0.7 10*3/uL (ref 0.2–0.9)
Monocytes Relative: 7 %
Neutro Abs: 6.2 10*3/uL (ref 1.4–6.5)
Platelets: 365 10*3/uL (ref 150–440)
RBC: 4.18 MIL/uL (ref 3.80–5.20)
RDW: 12.6 % (ref 11.5–14.5)
WBC: 9.5 10*3/uL (ref 3.6–11.0)

## 2015-12-05 LAB — RAPID HIV SCREEN (HIV 1/2 AB+AG)
HIV 1/2 ANTIBODIES: NONREACTIVE
HIV-1 P24 ANTIGEN - HIV24: NONREACTIVE

## 2015-12-05 LAB — POCT PREGNANCY, URINE: PREG TEST UR: NEGATIVE

## 2015-12-05 SURGERY — VULVAR LESION
Anesthesia: General | Laterality: Left | Wound class: Clean Contaminated

## 2015-12-05 MED ORDER — LIDOCAINE HCL (CARDIAC) 20 MG/ML IV SOLN
INTRAVENOUS | Status: DC | PRN
  Administered 2015-12-05: 60 mg via INTRAVENOUS

## 2015-12-05 MED ORDER — IBUPROFEN 800 MG PO TABS: 800.0000 mg | ORAL_TABLET | Freq: Three times a day (TID) | ORAL | Status: DC

## 2015-12-05 MED ORDER — ONDANSETRON HCL 4 MG/2ML IJ SOLN
INTRAMUSCULAR | Status: DC | PRN
  Administered 2015-12-05: 4 mg via INTRAVENOUS

## 2015-12-05 MED ORDER — FAMOTIDINE 20 MG PO TABS
ORAL_TABLET | ORAL | Status: AC
  Filled 2015-12-05: qty 1

## 2015-12-05 MED ORDER — FENTANYL CITRATE (PF) 100 MCG/2ML IJ SOLN
INTRAMUSCULAR | Status: DC | PRN
  Administered 2015-12-05 (×2): 50 ug via INTRAVENOUS

## 2015-12-05 MED ORDER — OXYCODONE-ACETAMINOPHEN 5-325 MG PO TABS: 1.0000 | ORAL_TABLET | ORAL | Status: DC | PRN

## 2015-12-05 MED ORDER — LACTATED RINGERS IV SOLN
INTRAVENOUS | Status: DC
  Administered 2015-12-05 (×2): via INTRAVENOUS

## 2015-12-05 MED ORDER — LACTATED RINGERS IV SOLN: INTRAVENOUS | Status: DC

## 2015-12-05 MED ORDER — FAMOTIDINE 20 MG PO TABS
20.0000 mg | ORAL_TABLET | Freq: Once | ORAL | Status: AC
  Administered 2015-12-05: 20 mg via ORAL

## 2015-12-05 MED ORDER — DEXAMETHASONE SODIUM PHOSPHATE 10 MG/ML IJ SOLN
INTRAMUSCULAR | Status: DC | PRN
  Administered 2015-12-05: 10 mg via INTRAVENOUS

## 2015-12-05 MED ORDER — MIDAZOLAM HCL 2 MG/2ML IJ SOLN
INTRAMUSCULAR | Status: DC | PRN
Start: 2015-12-05 — End: 2015-12-05
  Administered 2015-12-05: 2 mg via INTRAVENOUS

## 2015-12-05 MED ORDER — FENTANYL CITRATE (PF) 100 MCG/2ML IJ SOLN: 25.0000 ug | INTRAMUSCULAR | Status: DC | PRN

## 2015-12-05 MED ORDER — PROPOFOL 500 MG/50ML IV EMUL
INTRAVENOUS | Status: DC | PRN
  Administered 2015-12-05: 60 ug/kg/min via INTRAVENOUS

## 2015-12-05 MED ORDER — LIDOCAINE-EPINEPHRINE 1 %-1:100000 IJ SOLN
INTRAMUSCULAR | Status: AC
  Filled 2015-12-05: qty 1

## 2015-12-05 MED ORDER — LIDOCAINE-EPINEPHRINE 1 %-1:100000 IJ SOLN
INTRAMUSCULAR | Status: DC | PRN
  Administered 2015-12-05: 10 mL

## 2015-12-05 MED ORDER — ONDANSETRON HCL 4 MG/2ML IJ SOLN: 4.0000 mg | Freq: Once | INTRAMUSCULAR | Status: DC | PRN

## 2015-12-05 MED ORDER — PHENYLEPHRINE HCL 10 MG/ML IJ SOLN
INTRAMUSCULAR | Status: DC | PRN
  Administered 2015-12-05: 100 ug via INTRAVENOUS

## 2015-12-05 SURGICAL SUPPLY — 29 items
BLADE SURG 15 STRL LF DISP TIS (BLADE) ×1 IMPLANT
BLADE SURG 15 STRL SS (BLADE) ×2
BLADE SURG SZ10 CARB STEEL (BLADE) ×3 IMPLANT
CANISTER SUCT 1200ML W/VALVE (MISCELLANEOUS) ×3 IMPLANT
CATH ROBINSON RED A/P 16FR (CATHETERS) ×3 IMPLANT
DRAPE PERI LITHO V/GYN (MISCELLANEOUS) ×3 IMPLANT
DRAPE UNDER BUTTOCK W/FLU (DRAPES) ×3 IMPLANT
DRSG TELFA 3X8 NADH (GAUZE/BANDAGES/DRESSINGS) ×3 IMPLANT
ELECT CAUTERY BLADE 6.4 (BLADE) ×3 IMPLANT
ELECT CAUTERY NEEDLE TIP 1.0 (MISCELLANEOUS) ×3
ELECT REM PT RETURN 9FT ADLT (ELECTROSURGICAL) ×3
ELECTRODE CAUTERY NEDL TIP 1.0 (MISCELLANEOUS) ×1 IMPLANT
ELECTRODE REM PT RTRN 9FT ADLT (ELECTROSURGICAL) ×1 IMPLANT
GLOVE BIO SURGEON STRL SZ8 (GLOVE) ×3 IMPLANT
GOWN STRL REUS W/ TWL LRG LVL3 (GOWN DISPOSABLE) ×1 IMPLANT
GOWN STRL REUS W/ TWL XL LVL3 (GOWN DISPOSABLE) ×1 IMPLANT
GOWN STRL REUS W/TWL LRG LVL3 (GOWN DISPOSABLE) ×2
GOWN STRL REUS W/TWL XL LVL3 (GOWN DISPOSABLE) ×2
KIT RM TURNOVER CYSTO AR (KITS) ×3 IMPLANT
NEEDLE HYPO 25X1 1.5 SAFETY (NEEDLE) ×3 IMPLANT
NS IRRIG 500ML POUR BTL (IV SOLUTION) ×3 IMPLANT
PACK BASIN MINOR ARMC (MISCELLANEOUS) ×3 IMPLANT
PAD OB MATERNITY 4.3X12.25 (PERSONAL CARE ITEMS) ×3 IMPLANT
PAD PREP 24X41 OB/GYN DISP (PERSONAL CARE ITEMS) ×3 IMPLANT
SPONGE XRAY 4X4 16PLY STRL (MISCELLANEOUS) ×3 IMPLANT
SUT CHROMIC 2 0 SH (SUTURE) ×6 IMPLANT
SUT VIC AB 3-0 SH 27 (SUTURE) ×6
SUT VIC AB 3-0 SH 27X BRD (SUTURE) ×3 IMPLANT
SYR CONTROL 10ML (SYRINGE) ×3 IMPLANT

## 2015-12-05 NOTE — Op Note (Signed)
OPERATIVE NOTE:  Mary GageStephanie Sue Stephenson PROCEDURE DATE: 12/05/2015   PREOPERATIVE DIAGNOSIS:  1. Labia majora cyst, left, 2  POSTOPERATIVE DIAGNOSIS:  1. Labia majora cyst, left, 2  PROCEDURE: Wide local excision of vulvar cyst 2  SURGEON:  Herold HarmsMartin A Akeisha Lagerquist, MD ASSISTANTS: Or Tech ANESTHESIA: MAC and Local Anesthetic INDICATIONS: 35 y.o. [id number] who presents for excision of symptomatic left labia majora cyst 2  FINDINGS:   2 left labia majora cysts, each measuring approximately 1.5 cm, filled with purulent drainage, excised.   I/O's: Total I/O In: 650 [I.V.:650] Out: 55 [Urine:50; Blood:5] COUNTS:  YES SPECIMENS: Left labia majora cyst wall ANTIBIOTIC PROPHYLAXIS:N/A COMPLICATIONS: None immediate  PROCEDURE IN DETAIL: Patient was brought to the operating room versus placed in supine position. Intravenous anesthetic was employed along with 1% lidocaine with 1-200,000 epinephrine injected locally for the procedure. 10 cc total were injected. Patient was placed in the dorsal lithotomy position. A Betadine prep and drape was performed in standard fashion. The bladder was drained of 25 cc of urine. Scalpel was used to incise superficial skin. Attempt at dissecting the cyst intact was unsuccessful with subsequent penetration of the cyst wall with release of purulent drainage. This occurred with both cysts. The Surrounding Tissue Was Excised Using Needle Point Bovie Cautery As Well As Metzenbaum Scissors. Once hemostasis was obtained, the vulbar incision line was closed using simple interrupted sutures of 3-0 Vicryl. No complications were encountered. Procedure was well-tolerated.   [name], MD, ACO[facility]

## 2015-12-05 NOTE — Discharge Instructions (Signed)

## 2015-12-05 NOTE — Anesthesia Preprocedure Evaluation (Addendum)
Anesthesia Evaluation  Patient identified by MRN, date of birth, ID band Patient awake    Reviewed: Allergy & Precautions, NPO status , Patient's Chart, lab work & pertinent test results  Airway Mallampati: I  TM Distance: >3 FB Neck ROM: Full    Dental  (+) Caps   Pulmonary  TB HX and has been treated   Pulmonary exam normal breath sounds clear to auscultation       Cardiovascular Normal cardiovascular exam     Neuro/Psych negative neurological ROS  negative psych ROS   GI/Hepatic Neg liver ROS, PUD, Ulcerative colitis Hx   Endo/Other  negative endocrine ROS  Renal/GU negative Renal ROS     Musculoskeletal negative musculoskeletal ROS (+)   Abdominal Normal abdominal exam  (+)   Peds  Hematology negative hematology ROS (+)   Anesthesia Other Findings Hx of positive TB in 2011 which has been treated with antibiotics and patient has been cleared.  Reproductive/Obstetrics                            Anesthesia Physical Anesthesia Plan  ASA: II  Anesthesia Plan: MAC and General   Post-op Pain Management:    Induction: Intravenous  Airway Management Planned: Nasal Cannula  Additional Equipment:   Intra-op Plan:   Post-operative Plan:   Informed Consent: I have reviewed the patients History and Physical, chart, labs and discussed the procedure including the risks, benefits and alternatives for the proposed anesthesia with the patient or authorized representative who has indicated his/her understanding and acceptance.   Dental advisory given  Plan Discussed with: CRNA and Surgeon  Anesthesia Plan Comments:         Anesthesia Quick Evaluation

## 2015-12-05 NOTE — Transfer of Care (Signed)
Immediate Anesthesia Transfer of Care Note  Patient: Mary GageStephanie Sue Zia  Procedure(s) Performed: Procedure(s): WIDE LOCAL EXCISION LEFT LABIA MAJORA VULVAR CYST X 2 (Left)  Patient Location: PACU  Anesthesia Type:General  Level of Consciousness: awake, alert , oriented and patient cooperative  Airway & Oxygen Therapy: Patient Spontanous Breathing and Patient connected to face mask oxygen  Post-op Assessment: Report given to RN, Post -op Vital signs reviewed and stable and Patient moving all extremities X 4  Post vital signs: Reviewed and stable  Last Vitals:  Filed Vitals:   12/05/15 1341  BP: 109/74  Pulse: 80  Temp: 36.9 C  Resp: 18    Last Pain: There were no vitals filed for this visit.       Complications: No apparent anesthesia complications

## 2015-12-05 NOTE — Interval H&P Note (Signed)
History and Physical Interval Note:  12/05/2015 2:10 PM  Mary Stephenson  has presented today for surgery, with the diagnosis of vulvar lesion  The various methods of treatment have been discussed with the patient and family. After consideration of risks, benefits and other options for treatment, the patient has consented to  Procedure(s): EXCISION VULVAR LESION (N/A) as a surgical intervention .  The patient's history has been reviewed, patient examined, no change in status, stable for surgery.  I have reviewed the patient's chart and labs.  Questions were answered to the patient's satisfaction.     Daphine DeutscherMartin A Micheal Sheen

## 2015-12-05 NOTE — H&P (View-Only) (Signed)
Subjective:    Patient is a 35 y.o. [id number] scheduled for Excision of vulvar lesions, symptomatic. The patient has 2 left labia majora cyst which are symptomatic with discomfort; they are not draining, nonerythematous, mobile.   Pertinent Gynecological History: No history of abnormal Pap smears. Cycles are regular. Contraception-OCPs  Menstrual History: OB History    Gravida Para Term Preterm AB TAB SAB Ectopic Multiple Living   2 2 2       2       Menarche age: NA Patient's last menstrual period was 11/11/2015.    Past Medical History  Diagnosis Date  . Ulcerative colitis (HCC) 2003    [facility]orenza BurtonKandice Jones [facility]  . History of chicken pox   . 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 History of rheumatic fever     treated, s/p preventative abx for years    Past Surgical History  Procedure Laterality Date  . Cholecystectomy  2005  . Dexa  06/2014    WNL  . Flexible sigmoidoscopy  06/2014    rectal colitis ([facility])  . Colonoscopy  2014  . Lasik      OB History  Gravida Para Term Preterm AB SAB TAB Ectopic Multiple Living  2 2 2       2     # Outcome Date GA Lbr Len/2nd Weight Sex Delivery Anes PTL Lv  2 Term 2013   6 lb 1.9 oz (2.776 kg) M Vag-Spont   Y  1 Term 2010   7 lb 9.6 oz (3.447 kg) M Vag-Spont   Y      Social History   Social History  . Marital Status: Married    Spouse Name: N/A  . Number of Children: 2  . Years of Education: N/A   Social History Main Topics  . Smoking status: Never Smoker   . Smokeless tobacco: Never Used  . Alcohol Use: 0.0 oz/week    0 Standard drinks or equivalent per week     Comment: RARE  . Drug Use: No  . Sexual Activity: Yes     Comment: vasectomy   Other Topics Concern  . None   Social History Narrative   Lives with husband and 2 children (2010, 2013), 2 dogs   Occupation: Education officer, environmentalsonographer at [employer]   Edu: College   Activity: no regular exercise   Diet: good water, avoids high fiber 2/2 UC    Family History  Problem Relation Age of Onset  . Diabetes type I Brother 13  . Cancer Paternal Grandfather     esophageal (smoker)  . Cancer Other 80    breast (great grandmother)  . Cancer Other     colon (maternal great uncles)  . CAD Neg Hx   . Stroke Neg Hx   . Hypertension Neg Hx   . Breast cancer Maternal Grandmother      (Not in a hospital admission)  Allergies  Allergen Reactions  . Penicillins Hives  . Sulfa Antibiotics Hives    Review of Systems Constitutional: No recent fever/chills/sweats Respiratory: No recent cough/bronchitis Cardiovascular: No chest pain Gastrointestinal: No recent nausea/vomiting/diarrhea Genitourinary: No UTI symptoms Hematologic/lymphatic:No history of coagulopathy or recent blood thinner use    Objective:    BP 98/66 mmHg  Pulse 98  Ht 5\' 7"  (1.702 m)  Wt 124 lb 4.8 oz (56.382 kg)  BMI 19.46 kg/m2  LMP 11/11/2015  General:   Normal  Skin:   normal  HEENT:  Normal  Neck:  Supple without Adenopathy or Thyromegaly  Lungs:   Heart:              Breasts:   Abdomen:  Pelvis:  M/S   Extremeties:  Neuro:    clear to auscultation bilaterally   Normal without murmur   Not Examined   soft, non-tender; bowel sounds normal; no masses,  no organomegaly   Exam deferred to OR  No CVAT  Warm/Dry   Normal       12/01/2015 Pelvic: Left labia majora with 2 cystic lesions, each measuring approximately 2 cm in diameter.  Assessment:    1. Left labia majora cysts 2   Plan:  Excision of left labia majora cysts  Preoperative counseling: The patient is to undergo excision of left labia majora cysts on 12/04/2015. She is understanding of the planned procedure and is aware of and is accepting of all surgical risks which include but are not limited to bleeding, infection, pelvic organ injury with need for repair, blood clot disorders,  anesthesia risks, etc. All questions have been answered. Informed consent is given. Patient is ready and willing to proceed with surgery as scheduled.  Dov Dill A Amarisa Wilinski, MD  Note: This dictation was prepared with Dragon dictation along with smaller phrase technology. Any transcriptional errors that result from this process are unintentional.    

## 2015-12-06 ENCOUNTER — Encounter: Payer: Self-pay | Admitting: Obstetrics and Gynecology

## 2015-12-06 LAB — RPR: RPR Ser Ql: NONREACTIVE

## 2015-12-07 LAB — SURGICAL PATHOLOGY

## 2015-12-07 NOTE — Anesthesia Postprocedure Evaluation (Signed)
Anesthesia Post Note  Patient: Mary ForemanStephanie Sue Stephenson  Procedure(s) Performed: Procedure(s) (LRB): WIDE LOCAL EXCISION LEFT LABIA MAJORA VULVAR CYST X 2 (Left)  Patient location during evaluation: PACU Anesthesia Type: General Level of consciousness: awake and alert and oriented Pain management: pain level controlled Vital Signs Assessment: post-procedure vital signs reviewed and stable Respiratory status: spontaneous breathing Cardiovascular status: blood pressure returned to baseline Anesthetic complications: no    Last Vitals:  Filed Vitals:   12/05/15 1704 12/05/15 1711  BP: 106/75 113/77  Pulse: 69 70  Temp:    Resp: 14 14    Last Pain:  Filed Vitals:   12/06/15 0808  PainSc: 0-No pain                 Breckyn Ticas

## 2015-12-08 ENCOUNTER — Ambulatory Visit (INDEPENDENT_AMBULATORY_CARE_PROVIDER_SITE_OTHER): Admitting: Obstetrics and Gynecology

## 2015-12-08 ENCOUNTER — Encounter: Payer: Self-pay | Admitting: Obstetrics and Gynecology

## 2015-12-08 VITALS — BP 105/71 | HR 84 | Ht 67.0 in | Wt 122.4 lb

## 2015-12-08 DIAGNOSIS — N9089 Other specified noninflammatory disorders of vulva and perineum: Secondary | ICD-10-CM

## 2015-12-08 DIAGNOSIS — Z09 Encounter for follow-up examination after completed treatment for conditions other than malignant neoplasm: Secondary | ICD-10-CM

## 2015-12-08 DIAGNOSIS — N907 Vulvar cyst: Secondary | ICD-10-CM

## 2015-12-08 MED ORDER — OXYCODONE-ACETAMINOPHEN 5-325 MG PO TABS: 1.0000 | ORAL_TABLET | ORAL | Status: AC | PRN

## 2015-12-08 MED ORDER — IBUPROFEN 800 MG PO TABS: 800.0000 mg | ORAL_TABLET | Freq: Three times a day (TID) | ORAL | Status: AC | PRN

## 2015-12-08 NOTE — Progress Notes (Signed)
Chief complaint: 1. Postop check status post excision of vulvar cystic lesions  Patient presents for postop check 3 days status post surgery for removal of 2 left labia majora cystic masses.  Moderate swelling developed of the incision site over the past 48 hours. This was treated with ice compression. She had not developed any fever or chills or sweats. No significant drainage.   Pathology:DIAGNOSIS:  A. VULVAR CYST; EXCISION:  - INFLAMED AND RUPTURED BENIGN CYST, SEE COMMENT.   Comment:  Extensive inflammation and rupture makes classification of the  epithelial cyst lining challenging. There are focal areas of inflamed  intact epithelium resembling transitional type epithelium with squamous  metaplasia. Given these findings a Bartholin duct cyst is favored.   OBJECTIVE: BP 105/71 mmHg  Pulse 84  Ht 5\' 7"  (1.702 m)  Wt 122 lb 6.4 oz (55.52 kg)  BMI 19.17 kg/m2  LMP 12/07/2015 Pleasant white female in no acute distress. Ambulation is normal. Pelvic: Vulvar hematoma, non-tense, left labia majora, without significant drainage. Suture line is intact Rectal exam reveals normal sphincter tone. No rectal masses  ASSESSMENT: 1. Incision site hematoma 2 days status post excision of left labia majora cyst 2, and noninfected  PLAN: 1. Sitz bath twice a day 2. Ice pack to vulva 4 times a day 3. Tylenol/ibuprofen for pain relief 4. Percocet prescription is given for severe pain.  Patient is to follow up with new gynecologist after her transfer to [place] with the [employer] [facility].   Herold HarmsMartin A Eligio Angert, MD   Note: This dictation was prepared with Dragon dictation along with smaller phrase technology. Any transcriptional errors that result from this process are unintentional. c

## 2016-01-17 ENCOUNTER — Encounter: Payer: Self-pay | Admitting: Obstetrics and Gynecology

## 2016-01-25 ENCOUNTER — Telehealth: Payer: Self-pay

## 2016-01-25 NOTE — Telephone Encounter (Signed)
Patient moved to [place]. She thinks she is having a flare up with her Colitis. She scheduled an appointment with the GI over there but she can't get in until August 9. She has been taken the [facility] but she wants advice to know what she should do. She is wondering if she needs to go into the ER or not.

## 2016-01-25 NOTE — Telephone Encounter (Signed)
Spoke with pt regarding her colitis. She decided after leaving the message this morning she would just go ahead to the ER seeing as she is in [place] and there isn't anything Dr. Servando Snare could do for her there. Pt stated she does have an appt with her new GI on August 9th.

## 2016-04-06 ENCOUNTER — Other Ambulatory Visit: Payer: Federal, State, Local not specified - PPO

## 2016-04-10 ENCOUNTER — Encounter: Payer: Federal, State, Local not specified - PPO | Admitting: Family Medicine
